# Patient Record
Sex: Male | Born: 1985 | Race: Black or African American | Hispanic: No | Marital: Married | Smoking: Current every day smoker
Health system: Southern US, Community
[De-identification: ages and names within clinical notes are randomized; demographics above are authoritative.]

## PROBLEM LIST (undated history)

## (undated) DIAGNOSIS — R0600 Dyspnea, unspecified: Secondary | ICD-10-CM

## (undated) HISTORY — PX: KNEE ARTHROSCOPY: SHX127

---
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
## 2004-03-22 ENCOUNTER — Emergency Department (HOSPITAL_COMMUNITY): Admission: EM | Admit: 2004-03-22 | Discharge: 2004-03-22 | Payer: Self-pay | Admitting: Family Medicine

## 2004-12-02 ENCOUNTER — Emergency Department (HOSPITAL_COMMUNITY): Admission: EM | Admit: 2004-12-02 | Discharge: 2004-12-02 | Payer: Self-pay | Admitting: Family Medicine

## 2005-06-30 ENCOUNTER — Emergency Department (HOSPITAL_COMMUNITY): Admission: EM | Admit: 2005-06-30 | Discharge: 2005-06-30 | Payer: Self-pay | Admitting: Emergency Medicine

## 2005-07-30 ENCOUNTER — Emergency Department (HOSPITAL_COMMUNITY): Admission: EM | Admit: 2005-07-30 | Discharge: 2005-07-30 | Payer: Self-pay | Admitting: Family Medicine

## 2005-07-31 ENCOUNTER — Emergency Department (HOSPITAL_COMMUNITY): Admission: EM | Admit: 2005-07-31 | Discharge: 2005-07-31 | Payer: Self-pay | Admitting: Family Medicine

## 2005-12-13 ENCOUNTER — Emergency Department (HOSPITAL_COMMUNITY): Admission: EM | Admit: 2005-12-13 | Discharge: 2005-12-13 | Payer: Self-pay | Admitting: Family Medicine

## 2007-03-22 ENCOUNTER — Emergency Department (HOSPITAL_COMMUNITY): Admission: EM | Admit: 2007-03-22 | Discharge: 2007-03-22 | Payer: Self-pay | Admitting: *Deleted

## 2007-09-01 ENCOUNTER — Emergency Department (HOSPITAL_COMMUNITY): Admission: EM | Admit: 2007-09-01 | Discharge: 2007-09-01 | Payer: Self-pay | Admitting: Family Medicine

## 2008-11-16 ENCOUNTER — Emergency Department (HOSPITAL_COMMUNITY): Admission: EM | Admit: 2008-11-16 | Discharge: 2008-11-16 | Payer: Self-pay | Admitting: Emergency Medicine

## 2012-06-13 ENCOUNTER — Encounter (HOSPITAL_COMMUNITY): Payer: Self-pay | Admitting: Emergency Medicine

## 2012-06-13 ENCOUNTER — Emergency Department (INDEPENDENT_AMBULATORY_CARE_PROVIDER_SITE_OTHER)
Admission: EM | Admit: 2012-06-13 | Discharge: 2012-06-13 | Disposition: A | Discharge status: Home / Self Care | Payer: PRIVATE HEALTH INSURANCE | Source: Home / Self Care | Attending: Family Medicine | Admitting: Family Medicine

## 2012-06-13 DIAGNOSIS — J069 Acute upper respiratory infection, unspecified: Secondary | ICD-10-CM

## 2012-06-13 LAB — POCT RAPID STREP A: Streptococcus, Group A Screen (Direct): NEGATIVE

## 2012-06-13 MED ORDER — GUAIFENESIN-CODEINE 100-10 MG/5ML PO SYRP: 5.0000 mL | ORAL_SOLUTION | Freq: Three times a day (TID) | ORAL | Status: DC | PRN

## 2012-06-13 MED ORDER — PROMETHAZINE HCL 25 MG PO TABS: 25.0000 mg | ORAL_TABLET | Freq: Three times a day (TID) | ORAL | Status: DC | PRN

## 2012-06-13 MED ORDER — CETIRIZINE-PSEUDOEPHEDRINE ER 5-120 MG PO TB12: 1.0000 | ORAL_TABLET | Freq: Two times a day (BID) | ORAL | Status: DC | PRN

## 2012-06-13 MED ORDER — BENZONATATE 100 MG PO CAPS: 100.0000 mg | ORAL_CAPSULE | Freq: Three times a day (TID) | ORAL | Status: DC

## 2012-06-13 NOTE — ED Provider Notes (Signed)
History     CSN: [id number]  Arrival date & time 06/13/12  1443   First MD Initiated Contact with Patient 06/13/12 1519      Chief Complaint  Patient presents with  . Hematemesis    (Consider location/radiation/quality/duration/timing/severity/associated sxs/prior treatment) HPI Comments: 27 year old smoker male. Here complaining of sore throat, nasal congestion, cough productive of clear sputum for 3 days. This morning symptoms associated with posttussive emesis with streaks of blood. Denies chest pain, shortness of breath or wheezing. No fever although reports he fell sweating after vomiting. Denies abdominal pain or diarrhea. Also denies current nausea, nausea or vomiting is triggered by cough. No prior history of gastric ulcers or hematemesis. No history of liver disease or jaundice. Denies recent alcohol intake. Denies ever having problems with heavy drinking.    History reviewed. No pertinent past medical history.  History reviewed. No pertinent past surgical history.  No family history on file.  History  Substance Use Topics  . Smoking status: Current Every Day Smoker -- 0.50 packs/day    Types: Cigarettes  . Smokeless tobacco: Not on file  . Alcohol Use: Yes      Review of Systems  Constitutional: Negative for fever, chills and appetite change.  HENT: Positive for congestion, sore throat and rhinorrhea.   Respiratory: Positive for cough. Negative for shortness of breath and wheezing.   Cardiovascular: Negative for chest pain.  Gastrointestinal: Negative for abdominal pain.       Posttussive emesis as per history present illness  Skin: Negative for rash.  Neurological: Negative for dizziness and headaches.  All other systems reviewed and are negative.    Allergies  Review of patient's allergies indicates no known allergies.  Home Medications   Current Outpatient Rx  Name  Route  Sig  Dispense  Refill  . benzonatate (TESSALON) 100 MG capsule   Oral   Take  1 capsule (100 mg total) by mouth every 8 (eight) hours.   21 capsule   0   . cetirizine-pseudoephedrine (ZYRTEC-D) 5-120 MG per tablet   Oral   Take 1 tablet by mouth 2 (two) times daily as needed for allergies.   30 tablet   0   . guaiFENesin-codeine (ROBITUSSIN AC) 100-10 MG/5ML syrup   Oral   Take 5 mLs by mouth 3 (three) times daily as needed for cough.   120 mL   0   . promethazine (PHENERGAN) 25 MG tablet   Oral   Take 1 tablet (25 mg total) by mouth every 8 (eight) hours as needed for nausea.   15 tablet   0     BP 122/69  Pulse 77  Temp(Src) 98.3 F (36.8 C) (Oral)  Resp 16  SpO2 97%  Physical Exam  Nursing note and vitals reviewed. Constitutional: He is oriented to person, place, and time. He appears well-developed and well-nourished. No distress.  HENT:  Head: Normocephalic and atraumatic.  Nasal Congestion with erythema and swelling of nasal turbinates, clear rhinorrhea. Significant pharyngeal erythema no exudates. No uvula deviation. No trismus. TM's normal  Eyes: Conjunctivae are normal. Right eye exhibits no discharge. Left eye exhibits no discharge. No scleral icterus.  Neck: Neck supple. No thyromegaly present.  Cardiovascular: Normal rate and regular rhythm.   Pulmonary/Chest: Effort normal and breath sounds normal. No respiratory distress. He has no wheezes. He has no rales. He exhibits no tenderness.  Abdominal: Soft. Bowel sounds are normal. He exhibits no distension and no mass. There is no tenderness. There is  no rebound and no guarding.  Lymphadenopathy:    He has no cervical adenopathy.  Neurological: He is alert and oriented to person, place, and time.  Skin: No rash noted. He is not diaphoretic.    ED Course  Procedures (including critical care time)  Labs Reviewed  POCT RAPID STREP A ([facility] ONLY)   No results found.   1. URI (upper respiratory infection)       MDM  Negative strep test. Lungs clear to auscultation.  Normal vital signs. Clinically well. Prescribe Tessalon Perles, cetirizine/pseudoephedrine, guaifenesin/codeine and Phenergan. Supportive care and red flags that should prompt his return to medical attention discussed with patient and provided in writing.        Sharin Grave, MD 06/14/12 1143

## 2012-06-13 NOTE — ED Notes (Signed)
Pt is concerned about an episode of hematemesis this am around 0615 Sx include: dry cough and face feels like its swollen Denies: ST, f/v/n/d, abd pain  He is alert and oriented w/no signs of acute distress.

## 2012-10-07 ENCOUNTER — Emergency Department (HOSPITAL_COMMUNITY)
Admission: EM | Admit: 2012-10-07 | Discharge: 2012-10-07 | Disposition: A | Discharge status: Home / Self Care | Payer: Self-pay | Source: Home / Self Care | Attending: Emergency Medicine | Admitting: Emergency Medicine

## 2012-10-07 ENCOUNTER — Encounter (HOSPITAL_COMMUNITY): Payer: Self-pay | Admitting: Emergency Medicine

## 2012-10-07 ENCOUNTER — Other Ambulatory Visit (HOSPITAL_COMMUNITY)
Admission: RE | Admit: 2012-10-07 | Discharge: 2012-10-07 | Disposition: A | Discharge status: Home / Self Care | Payer: Commercial Managed Care - PPO | Source: Ambulatory Visit | Attending: Emergency Medicine | Admitting: Emergency Medicine

## 2012-10-07 DIAGNOSIS — Z113 Encounter for screening for infections with a predominantly sexual mode of transmission: Secondary | ICD-10-CM | POA: Insufficient documentation

## 2012-10-07 DIAGNOSIS — Z202 Contact with and (suspected) exposure to infections with a predominantly sexual mode of transmission: Secondary | ICD-10-CM

## 2012-10-07 MED ORDER — METRONIDAZOLE 500 MG PO TABS: ORAL_TABLET | ORAL | Status: DC

## 2012-10-07 MED ORDER — LIDOCAINE HCL (PF) 1 % IJ SOLN
INTRAMUSCULAR | Status: AC
  Filled 2012-10-07: qty 5

## 2012-10-07 MED ORDER — CEFTRIAXONE SODIUM 1 G IJ SOLR
INTRAMUSCULAR | Status: AC
  Filled 2012-10-07: qty 10

## 2012-10-07 MED ORDER — AZITHROMYCIN 250 MG PO TABS
ORAL_TABLET | ORAL | Status: AC
  Filled 2012-10-07: qty 4

## 2012-10-07 MED ORDER — CEFTRIAXONE SODIUM 250 MG IJ SOLR
250.0000 mg | Freq: Once | INTRAMUSCULAR | Status: AC
  Administered 2012-10-07: 250 mg via INTRAMUSCULAR

## 2012-10-07 MED ORDER — AZITHROMYCIN 250 MG PO TABS
1000.0000 mg | ORAL_TABLET | Freq: Once | ORAL | Status: AC
  Administered 2012-10-07: 1000 mg via ORAL

## 2012-10-07 NOTE — ED Provider Notes (Signed)
Chief Complaint:   Chief Complaint  Patient presents with  . Exposure to STD    was told by partner that she tested positive for trich    History of Present Illness:   Mario Ingram is a 27 year old male who was told by his girlfriend that she tested positive for Trichomonas. He comes in today for treatment. He has not had any refill discharge, dysuria, penile pain, or penile lesions. He denies any inguinal adenopathy, abdominal pain, or testicular pain. He's had no fever, chills, skin rash, or joint pain. No prior history of STDs.  Review of Systems:  Other than noted above, the patient denies any of the following symptoms: Systemic:  No fevers chills, aches, weight loss, arthralgias, myalgias, or adenopathy. GI:  No abdominal pain, nausea or vomiting. GU:  No dysuria, penile pain, discharge, itching, dysuria, genital lesions, testicular pain or swelling. Skin:  No rash or itching.  PMFSH:  Past medical history, family history, social history, meds, and allergies were reviewed.   Physical Exam:   Vital signs:  BP 136/69  Pulse 81  Temp(Src) 98.2 F (36.8 C) (Oral)  SpO2 100% Gen:  Alert, oriented, in no distress. Abdomen:  Soft and flat, non-distended, and non-tender.  No organomegaly or mass. Genital:  Normal exam. No urethral discharge, no penile lesions, no inguinal lymphadenopathy, testes were normal. Skin:  Warm and dry.  No rash.   Labs:  DNA probe for gonorrhea, Chlamydia, Trichomonas obtained. Patient declines testing for HIV or syphilis.  Assessment:  The encounter diagnosis was Exposure to STD.  Exposure to Trichomonas. Will treat for that. Offered treatment for gonorrhea and Chlamydia as well, the patient would like to await results of DNA probes.  Plan:   1.  The following meds were prescribed:   Discharge Medication List as of 10/07/2012 10:00 AM    START taking these medications   Details  metroNIDAZOLE (FLAGYL) 500 MG tablet Take 2 tabs twice daily for 1 day.,  Normal       2.  The patient was instructed in symptomatic care and handouts were given. 3.  The patient was told to return if becoming worse in any way, if no better in 3 or 4 days, and given some red flag symptoms such as difficulty urinating that would indicate earlier return. 4.  The patient was instructed to inform all sexual contacts, avoid intercourse completely for 2 weeks and then only with a condom.  The patient was told that we would call about all abnormal lab results, and that we would need to report certain kinds of infection to the health department. 5.  Follow up here if necessary.    Reuben Likes, MD 10/07/12 [phone]

## 2012-10-07 NOTE — ED Notes (Signed)
Pt given injection will discharge at 10:22

## 2012-10-07 NOTE — ED Notes (Signed)
Pt here for treatment. Was told by partner that she tested positive for trich. Pt denies having any symptoms. At this time.

## 2013-03-26 ENCOUNTER — Ambulatory Visit (INDEPENDENT_AMBULATORY_CARE_PROVIDER_SITE_OTHER): Payer: Commercial Managed Care - PPO

## 2013-03-26 ENCOUNTER — Other Ambulatory Visit: Payer: Self-pay | Admitting: Family Medicine

## 2013-03-26 DIAGNOSIS — R52 Pain, unspecified: Secondary | ICD-10-CM

## 2013-03-26 DIAGNOSIS — M898X9 Other specified disorders of bone, unspecified site: Secondary | ICD-10-CM

## 2013-05-21 ENCOUNTER — Ambulatory Visit (INDEPENDENT_AMBULATORY_CARE_PROVIDER_SITE_OTHER): Payer: Commercial Managed Care - PPO | Admitting: Physical Therapy

## 2013-05-21 DIAGNOSIS — R269 Unspecified abnormalities of gait and mobility: Secondary | ICD-10-CM

## 2013-05-21 DIAGNOSIS — M25669 Stiffness of unspecified knee, not elsewhere classified: Secondary | ICD-10-CM

## 2013-05-21 DIAGNOSIS — M25569 Pain in unspecified knee: Secondary | ICD-10-CM

## 2013-05-21 DIAGNOSIS — R609 Edema, unspecified: Secondary | ICD-10-CM

## 2013-05-26 ENCOUNTER — Encounter (INDEPENDENT_AMBULATORY_CARE_PROVIDER_SITE_OTHER): Payer: Worker's Compensation | Admitting: Physical Therapy

## 2013-05-26 DIAGNOSIS — M25569 Pain in unspecified knee: Secondary | ICD-10-CM

## 2013-05-26 DIAGNOSIS — M25669 Stiffness of unspecified knee, not elsewhere classified: Secondary | ICD-10-CM

## 2013-05-26 DIAGNOSIS — R609 Edema, unspecified: Secondary | ICD-10-CM

## 2013-05-26 DIAGNOSIS — R269 Unspecified abnormalities of gait and mobility: Secondary | ICD-10-CM

## 2013-05-29 ENCOUNTER — Encounter (INDEPENDENT_AMBULATORY_CARE_PROVIDER_SITE_OTHER): Payer: Worker's Compensation

## 2013-05-29 DIAGNOSIS — M25669 Stiffness of unspecified knee, not elsewhere classified: Secondary | ICD-10-CM

## 2013-05-29 DIAGNOSIS — M25569 Pain in unspecified knee: Secondary | ICD-10-CM

## 2013-05-29 DIAGNOSIS — R269 Unspecified abnormalities of gait and mobility: Secondary | ICD-10-CM

## 2013-05-29 DIAGNOSIS — R609 Edema, unspecified: Secondary | ICD-10-CM

## 2013-06-04 ENCOUNTER — Encounter (INDEPENDENT_AMBULATORY_CARE_PROVIDER_SITE_OTHER): Payer: Worker's Compensation

## 2013-06-04 DIAGNOSIS — M25669 Stiffness of unspecified knee, not elsewhere classified: Secondary | ICD-10-CM

## 2013-06-04 DIAGNOSIS — M25569 Pain in unspecified knee: Secondary | ICD-10-CM

## 2013-06-04 DIAGNOSIS — R609 Edema, unspecified: Secondary | ICD-10-CM

## 2013-06-04 DIAGNOSIS — R269 Unspecified abnormalities of gait and mobility: Secondary | ICD-10-CM

## 2013-06-05 ENCOUNTER — Encounter (INDEPENDENT_AMBULATORY_CARE_PROVIDER_SITE_OTHER): Payer: Commercial Managed Care - PPO | Admitting: Physical Therapy

## 2013-06-05 DIAGNOSIS — R609 Edema, unspecified: Secondary | ICD-10-CM

## 2013-06-05 DIAGNOSIS — M25569 Pain in unspecified knee: Secondary | ICD-10-CM

## 2013-06-05 DIAGNOSIS — M25669 Stiffness of unspecified knee, not elsewhere classified: Secondary | ICD-10-CM

## 2013-06-05 DIAGNOSIS — R269 Unspecified abnormalities of gait and mobility: Secondary | ICD-10-CM

## 2013-06-09 ENCOUNTER — Encounter (INDEPENDENT_AMBULATORY_CARE_PROVIDER_SITE_OTHER): Payer: Worker's Compensation | Admitting: Physical Therapy

## 2013-06-09 DIAGNOSIS — M25569 Pain in unspecified knee: Secondary | ICD-10-CM

## 2013-06-09 DIAGNOSIS — R269 Unspecified abnormalities of gait and mobility: Secondary | ICD-10-CM

## 2013-06-09 DIAGNOSIS — M25669 Stiffness of unspecified knee, not elsewhere classified: Secondary | ICD-10-CM

## 2013-06-09 DIAGNOSIS — R609 Edema, unspecified: Secondary | ICD-10-CM

## 2013-06-11 ENCOUNTER — Encounter (INDEPENDENT_AMBULATORY_CARE_PROVIDER_SITE_OTHER): Payer: Worker's Compensation | Admitting: Physical Therapy

## 2013-06-11 DIAGNOSIS — M25669 Stiffness of unspecified knee, not elsewhere classified: Secondary | ICD-10-CM

## 2013-06-11 DIAGNOSIS — M25569 Pain in unspecified knee: Secondary | ICD-10-CM

## 2013-06-11 DIAGNOSIS — R269 Unspecified abnormalities of gait and mobility: Secondary | ICD-10-CM

## 2013-06-11 DIAGNOSIS — R609 Edema, unspecified: Secondary | ICD-10-CM

## 2013-06-12 ENCOUNTER — Encounter: Payer: Self-pay | Admitting: Physical Therapy

## 2013-06-16 ENCOUNTER — Encounter (INDEPENDENT_AMBULATORY_CARE_PROVIDER_SITE_OTHER): Payer: Worker's Compensation | Admitting: Physical Therapy

## 2013-06-16 DIAGNOSIS — M25669 Stiffness of unspecified knee, not elsewhere classified: Secondary | ICD-10-CM

## 2013-06-16 DIAGNOSIS — M25569 Pain in unspecified knee: Secondary | ICD-10-CM

## 2013-06-16 DIAGNOSIS — R609 Edema, unspecified: Secondary | ICD-10-CM

## 2013-06-16 DIAGNOSIS — R269 Unspecified abnormalities of gait and mobility: Secondary | ICD-10-CM

## 2013-06-18 ENCOUNTER — Encounter (INDEPENDENT_AMBULATORY_CARE_PROVIDER_SITE_OTHER): Payer: Worker's Compensation

## 2013-06-18 DIAGNOSIS — M25669 Stiffness of unspecified knee, not elsewhere classified: Secondary | ICD-10-CM

## 2013-06-18 DIAGNOSIS — M25569 Pain in unspecified knee: Secondary | ICD-10-CM

## 2013-06-18 DIAGNOSIS — R269 Unspecified abnormalities of gait and mobility: Secondary | ICD-10-CM

## 2013-06-18 DIAGNOSIS — R609 Edema, unspecified: Secondary | ICD-10-CM

## 2013-06-23 ENCOUNTER — Encounter (INDEPENDENT_AMBULATORY_CARE_PROVIDER_SITE_OTHER): Payer: Worker's Compensation | Admitting: Physical Therapy

## 2013-06-23 DIAGNOSIS — R609 Edema, unspecified: Secondary | ICD-10-CM

## 2013-06-23 DIAGNOSIS — M25569 Pain in unspecified knee: Secondary | ICD-10-CM

## 2013-06-23 DIAGNOSIS — R269 Unspecified abnormalities of gait and mobility: Secondary | ICD-10-CM

## 2013-06-23 DIAGNOSIS — M25669 Stiffness of unspecified knee, not elsewhere classified: Secondary | ICD-10-CM

## 2013-06-25 ENCOUNTER — Encounter (INDEPENDENT_AMBULATORY_CARE_PROVIDER_SITE_OTHER): Payer: Worker's Compensation | Admitting: Physical Therapy

## 2013-06-25 DIAGNOSIS — M25669 Stiffness of unspecified knee, not elsewhere classified: Secondary | ICD-10-CM

## 2013-06-25 DIAGNOSIS — R609 Edema, unspecified: Secondary | ICD-10-CM

## 2013-06-25 DIAGNOSIS — M25569 Pain in unspecified knee: Secondary | ICD-10-CM

## 2013-06-25 DIAGNOSIS — R293 Abnormal posture: Secondary | ICD-10-CM

## 2013-07-02 ENCOUNTER — Encounter (INDEPENDENT_AMBULATORY_CARE_PROVIDER_SITE_OTHER): Payer: Worker's Compensation | Admitting: Physical Therapy

## 2013-07-02 DIAGNOSIS — M25669 Stiffness of unspecified knee, not elsewhere classified: Secondary | ICD-10-CM

## 2013-07-02 DIAGNOSIS — R269 Unspecified abnormalities of gait and mobility: Secondary | ICD-10-CM

## 2013-07-02 DIAGNOSIS — R609 Edema, unspecified: Secondary | ICD-10-CM

## 2013-07-02 DIAGNOSIS — M25569 Pain in unspecified knee: Secondary | ICD-10-CM

## 2013-08-18 ENCOUNTER — Emergency Department (INDEPENDENT_AMBULATORY_CARE_PROVIDER_SITE_OTHER)
Admission: EM | Admit: 2013-08-18 | Discharge: 2013-08-18 | Disposition: A | Discharge status: Home / Self Care | Payer: Commercial Managed Care - PPO | Source: Home / Self Care | Attending: Family Medicine | Admitting: Family Medicine

## 2013-08-18 ENCOUNTER — Encounter (HOSPITAL_COMMUNITY): Payer: Self-pay | Admitting: Emergency Medicine

## 2013-08-18 DIAGNOSIS — K2900 Acute gastritis without bleeding: Secondary | ICD-10-CM

## 2013-08-18 MED ORDER — GI COCKTAIL ~~LOC~~
30.0000 mL | Freq: Once | ORAL | Status: AC
  Administered 2013-08-18: 30 mL via ORAL

## 2013-08-18 MED ORDER — RANITIDINE HCL 150 MG PO TABS: 150.0000 mg | ORAL_TABLET | Freq: Two times a day (BID) | ORAL | Status: DC

## 2013-08-18 MED ORDER — GI COCKTAIL ~~LOC~~
ORAL | Status: AC
Start: 2013-08-18 — End: 2013-08-18
  Filled 2013-08-18: qty 30

## 2013-08-18 NOTE — ED Provider Notes (Signed)
CSN: [id number]     Arrival date & time 08/18/13  1822 History   First MD Initiated Contact with Patient 08/18/13 1917     No chief complaint on file.  (Consider location/radiation/quality/duration/timing/severity/associated sxs/prior Treatment) Patient is a 28 y.o. male presenting with abdominal pain. The history is provided by the patient.  Abdominal Pain Pain location:  Epigastric Pain quality: burning   Pain radiates to:  Does not radiate Pain severity:  Mild Onset quality:  Gradual Duration:  2 days Chronicity:  New Context: not laxative use   Relieved by:  None tried Worsened by:  Nothing tried Ineffective treatments:  None tried Associated symptoms: belching and flatus   Associated symptoms: no constipation, no diarrhea, no fever, no nausea and no vomiting     No past medical history on file. No past surgical history on file. No family history on file. History  Substance Use Topics  . Smoking status: Current Every Day Smoker -- 0.50 packs/day    Types: Cigarettes  . Smokeless tobacco: Not on file  . Alcohol Use: Yes    Review of Systems  Constitutional: Negative.  Negative for fever.  Cardiovascular: Negative.   Gastrointestinal: Positive for abdominal pain and flatus. Negative for nausea, vomiting, diarrhea, constipation and blood in stool.  Skin: Negative.     Allergies  Review of patient's allergies indicates no known allergies.  Home Medications   Prior to Admission medications   Medication Sig Start Date End Date Taking? Authorizing Provider  benzonatate (TESSALON) 100 MG capsule Take 1 capsule (100 mg total) by mouth every 8 (eight) hours. 06/13/12   Adlih Moreno-Coll, MD  cetirizine-pseudoephedrine (ZYRTEC-D) 5-120 MG per tablet Take 1 tablet by mouth 2 (two) times daily as needed for allergies. 06/13/12   Adlih Moreno-Coll, MD  guaiFENesin-codeine (ROBITUSSIN AC) 100-10 MG/5ML syrup Take 5 mLs by mouth 3 (three) times daily as needed for cough. 06/13/12    Adlih Moreno-Coll, MD  metroNIDAZOLE (FLAGYL) 500 MG tablet Take 2 tabs twice daily for 1 day. 10/07/12   Reuben Likesavid C Keller, MD  promethazine (PHENERGAN) 25 MG tablet Take 1 tablet (25 mg total) by mouth every 8 (eight) hours as needed for nausea. 06/13/12   Adlih Moreno-Coll, MD  ranitidine (ZANTAC) 150 MG tablet Take 1 tablet (150 mg total) by mouth 2 (two) times daily. 08/18/13   Linna HoffJames D Shaely Gadberry, MD   BP 140/85  Pulse 67  Temp(Src) 98.5 F (36.9 C) (Oral)  SpO2 100% Physical Exam  Nursing note and vitals reviewed. Constitutional: He is oriented to person, place, and time. He appears well-developed and well-nourished.  Neck: Normal range of motion. Neck supple.  Cardiovascular: Normal heart sounds.   Pulmonary/Chest: Effort normal and breath sounds normal.  Abdominal: Soft. Bowel sounds are normal. He exhibits no distension and no mass. There is no tenderness. There is no rebound and no guarding.  Lymphadenopathy:    He has no cervical adenopathy.  Neurological: He is alert and oriented to person, place, and time.  Skin: Skin is warm and dry.    ED Course  Procedures (including critical care time) Labs Review Labs Reviewed - No data to display  Imaging Review No results found.   MDM   1. Acute gastritis without hemorrhage        Linna HoffJames D Taseen Marasigan, MD 08/18/13 [phone]

## 2013-08-18 NOTE — ED Notes (Signed)
Concern for epigastric pain and constipation

## 2014-06-17 ENCOUNTER — Encounter (HOSPITAL_COMMUNITY): Payer: Self-pay | Admitting: Emergency Medicine

## 2014-06-17 ENCOUNTER — Emergency Department (INDEPENDENT_AMBULATORY_CARE_PROVIDER_SITE_OTHER)
Admission: EM | Admit: 2014-06-17 | Discharge: 2014-06-17 | Disposition: A | Discharge status: Home / Self Care | Payer: No Typology Code available for payment source | Source: Home / Self Care | Attending: Family Medicine | Admitting: Family Medicine

## 2014-06-17 DIAGNOSIS — J111 Influenza due to unidentified influenza virus with other respiratory manifestations: Secondary | ICD-10-CM | POA: Diagnosis not present

## 2014-06-17 MED ORDER — OSELTAMIVIR PHOSPHATE 75 MG PO CAPS: 75.0000 mg | ORAL_CAPSULE | Freq: Two times a day (BID) | ORAL | Status: DC

## 2014-06-17 NOTE — ED Provider Notes (Signed)
Mario QuailsBrandon Ingram is a 29 y.o. male who presents to Urgent Care today for fevers chills for this or body ache and congestion. Symptoms present for one day. Wife and child both were diagnosed with the flu and treated with Tamiflu. No vomiting or diarrhea. No chest pains or palpitations. No flu vaccine this year. He has tried Mucinex which helps.   History reviewed. No pertinent past medical history. History reviewed. No pertinent past surgical history. History  Substance Use Topics  . Smoking status: Current Every Day Smoker -- 0.50 packs/day    Types: Cigarettes  . Smokeless tobacco: Not on file  . Alcohol Use: Yes   ROS as above Medications: No current facility-administered medications for this encounter.   Current Outpatient Prescriptions  Medication Sig Dispense Refill  . oseltamivir (TAMIFLU) 75 MG capsule Take 1 capsule (75 mg total) by mouth every 12 (twelve) hours. 10 capsule 0  . ranitidine (ZANTAC) 150 MG tablet Take 1 tablet (150 mg total) by mouth 2 (two) times daily. 60 tablet 0  . [DISCONTINUED] promethazine (PHENERGAN) 25 MG tablet Take 1 tablet (25 mg total) by mouth every 8 (eight) hours as needed for nausea. 15 tablet 0   No Known Allergies   Exam:  BP 143/75 mmHg  Pulse 79  Temp(Src) 98.8 F (37.1 C) (Oral)  Resp 18  SpO2 96% Gen: Well NAD HEENT: EOMI,  MMM normal posterior pharynx and tympanic membranes Lungs: Normal work of breathing. CTABL Heart: RRR no MRG Abd: NABS, Soft. Nondistended, Nontender Exts: Brisk capillary refill, warm and well perfused.   No results found for this or any previous visit (from the past 24 hour(s)). No results found.  Assessment and Plan: 29 y.o. male with influenza. Treatment with Tamiflu and over-the-counter medications for symptomatic management. Work note provided  Discussed warning signs or symptoms. Please see discharge instructions. Patient expresses understanding.     Rodolph BongEvan S Latise Dilley, MD 06/17/14 [phone]

## 2014-06-17 NOTE — ED Notes (Signed)
Pt states that he has had fever cold and chills since yesterday. He states that he checked his fever before work this morning and it was 103 degrees

## 2014-06-17 NOTE — Discharge Instructions (Signed)
Thank you for coming in today. °Call or go to the emergency room if you get worse, have trouble breathing, have chest pains, or palpitations.  ° °Influenza °Influenza ("the flu") is a viral infection of the respiratory tract. It occurs more often in winter months because people spend more time in close contact with one another. Influenza can make you feel very sick. Influenza easily spreads from person to person (contagious). °CAUSES  °Influenza is caused by a virus that infects the respiratory tract. You can catch the virus by breathing in droplets from an infected person's cough or sneeze. You can also catch the virus by touching something that was recently contaminated with the virus and then touching your mouth, nose, or eyes. °RISKS AND COMPLICATIONS °You may be at risk for a more severe case of influenza if you smoke cigarettes, have diabetes, have chronic heart disease (such as heart failure) or lung disease (such as asthma), or if you have a weakened immune system. Elderly people and pregnant women are also at risk for more serious infections. The most common problem of influenza is a lung infection (pneumonia). Sometimes, this problem can require emergency medical care and may be life threatening. °SIGNS AND SYMPTOMS  °Symptoms typically last 4 to 10 days and may include: °· Fever. °· Chills. °· Headache, body aches, and muscle aches. °· Sore throat. °· Chest discomfort and cough. °· Poor appetite. °· Weakness or feeling tired. °· Dizziness. °· Nausea or vomiting. °DIAGNOSIS  °Diagnosis of influenza is often made based on your history and a physical exam. A nose or throat swab test can be done to confirm the diagnosis. °TREATMENT  °In mild cases, influenza goes away on its own. Treatment is directed at relieving symptoms. For more severe cases, your health care provider may prescribe antiviral medicines to shorten the sickness. Antibiotic medicines are not effective because the infection is caused by a  virus, not by bacteria. °HOME CARE INSTRUCTIONS °· Take medicines only as directed by your health care provider. °· Use a cool mist humidifier to make breathing easier. °· Get plenty of rest until your temperature returns to normal. This usually takes 3 to 4 days. °· Drink enough fluid to keep your urine clear or pale yellow. °· Cover your mouth and nose when coughing or sneezing, and wash your hands well to prevent the virus from spreading. °· Stay home from work or school until the fever is gone for at least 1 full day. °PREVENTION  °An annual influenza vaccination (flu shot) is the best way to avoid getting influenza. An annual flu shot is now routinely recommended for all adults in the [place] °SEEK MEDICAL CARE IF: °· You experience chest pain, your cough worsens, or you produce more mucus. °· You have nausea, vomiting, or diarrhea. °· Your fever returns or gets worse. °SEEK IMMEDIATE MEDICAL CARE IF: °· You have trouble breathing, you become short of breath, or your skin or nails become bluish. °· You have severe pain or stiffness in the neck. °· You develop a sudden headache, or pain in the face or ear. °· You have nausea or vomiting that you cannot control. °MAKE SURE YOU:  °· Understand these instructions. °· Will watch your condition. °· Will get help right away if you are not doing well or get worse. °Document Released: 01/21/2000 Document Revised: 06/09/2013 Document Reviewed: 04/24/2011 °[employer]® Patient Information ©2015 [employer]. This information is not intended to replace advice given to you by your health care provider. Make sure you discuss any questions you have with your health care provider. ° °

## 2016-03-13 ENCOUNTER — Ambulatory Visit (HOSPITAL_COMMUNITY)
Admission: EM | Admit: 2016-03-13 | Discharge: 2016-03-13 | Disposition: A | Discharge status: Home / Self Care | Payer: Self-pay | Attending: Internal Medicine | Admitting: Internal Medicine

## 2016-03-13 DIAGNOSIS — S161XXA Strain of muscle, fascia and tendon at neck level, initial encounter: Secondary | ICD-10-CM

## 2016-03-13 DIAGNOSIS — M542 Cervicalgia: Secondary | ICD-10-CM

## 2016-03-13 MED ORDER — KETOROLAC TROMETHAMINE 60 MG/2ML IM SOLN
60.0000 mg | Freq: Once | INTRAMUSCULAR | Status: AC
  Administered 2016-03-13: 60 mg via INTRAMUSCULAR

## 2016-03-13 MED ORDER — CYCLOBENZAPRINE HCL 10 MG PO TABS: 10.0000 mg | ORAL_TABLET | Freq: Every day | ORAL | 0 refills | Status: DC

## 2016-03-13 MED ORDER — KETOROLAC TROMETHAMINE 60 MG/2ML IM SOLN
INTRAMUSCULAR | Status: AC
  Filled 2016-03-13: qty 2

## 2016-03-13 MED ORDER — NAPROXEN 500 MG PO TABS: 500.0000 mg | ORAL_TABLET | Freq: Two times a day (BID) | ORAL | 0 refills | Status: DC

## 2016-03-13 NOTE — ED Provider Notes (Signed)
CSN: [id number]     Arrival date & time 03/13/16  1124 History   None    Chief Complaint  Patient presents with  . Neck Pain   (Consider location/radiation/quality/duration/timing/severity/associated sxs/prior Treatment) Patient states he awoke this am and his neck is stiff and painful.  He awoke this am with the neck discomfort and decreased ROM in his neck. He states he took a muscle relaxer w/o results.   The history is provided by the patient.  Muscle Pain  This is a new problem. The problem occurs constantly. The problem has not changed since onset.Nothing aggravates the symptoms. Nothing relieves the symptoms. He has tried nothing for the symptoms.    No past medical history on file. No past surgical history on file. No family history on file. Social History  Substance Use Topics  . Smoking status: Current Every Day Smoker    Packs/day: 0.50    Types: Cigarettes  . Smokeless tobacco: Not on file  . Alcohol use Yes    Review of Systems  Constitutional: Negative.   HENT: Negative.   Eyes: Negative.   Respiratory: Negative.   Cardiovascular: Negative.   Gastrointestinal: Negative.   Endocrine: Negative.   Genitourinary: Negative.   Musculoskeletal: Positive for myalgias.  Skin: Negative.   Allergic/Immunologic: Negative.   Hematological: Negative.   Psychiatric/Behavioral: Negative.     Allergies  Patient has no known allergies.  Home Medications   Prior to Admission medications   Medication Sig Start Date End Date Taking? Authorizing Provider  cyclobenzaprine (FLEXERIL) 10 MG tablet Take 1 tablet (10 mg total) by mouth at bedtime. 03/13/16   Deatra Canter, FNP  naproxen (NAPROSYN) 500 MG tablet Take 1 tablet (500 mg total) by mouth 2 (two) times daily with a meal. 03/13/16   Deatra Canter, FNP  oseltamivir (TAMIFLU) 75 MG capsule Take 1 capsule (75 mg total) by mouth every 12 (twelve) hours. 06/17/14   Rodolph Bong, MD  ranitidine (ZANTAC) 150 MG tablet Take  1 tablet (150 mg total) by mouth 2 (two) times daily. 08/18/13   Linna Hoff, MD   Meds Ordered and Administered this Visit   Medications  ketorolac (TORADOL) injection 60 mg (not administered)    BP 119/65 (BP Location: Left Arm)   Pulse 69   Temp 98.5 F (36.9 C) (Oral)   Resp 16   SpO2 99%  No data found.   Physical Exam  Constitutional: He appears well-developed and well-nourished.  HENT:  Head: Normocephalic and atraumatic.  Eyes: Conjunctivae and EOM are normal. Pupils are equal, round, and reactive to light.  Neck: Normal range of motion. Neck supple.  Cardiovascular: Normal rate, regular rhythm and normal heart sounds.   Pulmonary/Chest: Effort normal and breath sounds normal.  Musculoskeletal: He exhibits tenderness.  Decreased ROM cervical spine. TTP bilateral cervical paraspinous muscles.  Nursing note and vitals reviewed.   Urgent Care Course     Procedures (including critical care time)  Labs Review Labs Reviewed - No data to display  Imaging Review No results found.   Visual Acuity Review  Right Eye Distance:   Left Eye Distance:   Bilateral Distance:    Right Eye Near:   Left Eye Near:    Bilateral Near:         MDM   1. Neck pain   2. Acute strain of neck muscle, initial encounter    Toradol 60mg  IM Naprosyn 500mg  one po bid x 10 days Flexeril 10mg   one po qhs prn #10      Deatra CanterWilliam J Oxford, FNP 03/13/16 1420

## 2016-03-13 NOTE — ED Triage Notes (Addendum)
C/o neck pain States he woke up and was not able to move head States he thought it was a crook in his neck at first Had some muscle  Relaxer but it did not help   No injury

## 2016-12-10 ENCOUNTER — Ambulatory Visit (HOSPITAL_COMMUNITY)
Admission: EM | Admit: 2016-12-10 | Discharge: 2016-12-10 | Disposition: A | Discharge status: Home / Self Care | Payer: No Typology Code available for payment source | Attending: Internal Medicine | Admitting: Internal Medicine

## 2016-12-10 ENCOUNTER — Encounter (HOSPITAL_COMMUNITY): Payer: Self-pay | Admitting: Emergency Medicine

## 2016-12-10 ENCOUNTER — Ambulatory Visit (INDEPENDENT_AMBULATORY_CARE_PROVIDER_SITE_OTHER): Payer: Self-pay

## 2016-12-10 DIAGNOSIS — S62511A Displaced fracture of proximal phalanx of right thumb, initial encounter for closed fracture: Secondary | ICD-10-CM

## 2016-12-10 DIAGNOSIS — M79641 Pain in right hand: Secondary | ICD-10-CM

## 2016-12-10 DIAGNOSIS — S62201A Unspecified fracture of first metacarpal bone, right hand, initial encounter for closed fracture: Secondary | ICD-10-CM

## 2016-12-10 MED ORDER — HYDROCODONE-ACETAMINOPHEN 5-325 MG PO TABS: 1.0000 | ORAL_TABLET | Freq: Four times a day (QID) | ORAL | 0 refills | Status: DC | PRN

## 2016-12-10 NOTE — ED Provider Notes (Signed)
[facility]    CSN: [id number] Arrival date & time: 12/10/16  1159     History   Chief Complaint Chief Complaint  Patient presents with  . Finger Injury    HPI Mario Ingram is a 31 y.o. male.   31 year -old male, presents today complaining of right hand pain. States that he was playing football yesterday and jammed his right thumb into a helmet. He has had pain to the base of the right thumb as well as swelling since that time. Patient is right handed. Denies any other injuries or complaints    The history is provided by the patient.  Hand Injury  Location:  Finger Finger location:  R thumb Injury: yes   Time since incident:  1 day Mechanism of injury comment:  Jammed right thumb into helmet  Pain details:    Quality:  Aching   Radiates to:  Does not radiate   Severity:  Moderate   Onset quality:  Gradual   Duration:  1 day   Timing:  Constant   Progression:  Unchanged Handedness:  Right-handed Dislocation: no   Foreign body present:  No foreign bodies Tetanus status:  Unknown Prior injury to area:  No Relieved by:  Nothing Worsened by:  Movement and stretching area Ineffective treatments:  Rest Associated symptoms: decreased range of motion and swelling   Associated symptoms: no back pain, no fatigue, no fever, no muscle weakness, no neck pain, no numbness, no stiffness and no tingling   Risk factors: no concern for non-accidental trauma, no known bone disorder, no frequent fractures and no recent illness     History reviewed. No pertinent past medical history.  There are no active problems to display for this patient.   History reviewed. No pertinent surgical history.     Home Medications    Prior to Admission medications   Medication Sig Start Date End Date Taking? Authorizing Provider  cyclobenzaprine (FLEXERIL) 10 MG tablet Take 1 tablet (10 mg total) by mouth at bedtime. 03/13/16   Deatra Canter, FNP  HYDROcodone-acetaminophen  (NORCO/VICODIN) 5-325 MG tablet Take 1 tablet every 6 (six) hours as needed by mouth. 12/10/16   Blue, Olivia C, PA-C  naproxen (NAPROSYN) 500 MG tablet Take 1 tablet (500 mg total) by mouth 2 (two) times daily with a meal. 03/13/16   Deatra Canter, FNP  oseltamivir (TAMIFLU) 75 MG capsule Take 1 capsule (75 mg total) by mouth every 12 (twelve) hours. 06/17/14   Rodolph Bong, MD  ranitidine (ZANTAC) 150 MG tablet Take 1 tablet (150 mg total) by mouth 2 (two) times daily. 08/18/13   Linna Hoff, MD    Family History History reviewed. No pertinent family history.  Social History Social History   Tobacco Use  . Smoking status: Current Every Day Smoker    Packs/day: 0.50    Types: Cigarettes  . Smokeless tobacco: Never Used  Substance Use Topics  . Alcohol use: Yes  . Drug use: No     Allergies   Patient has no known allergies.   Review of Systems Review of Systems  Constitutional: Negative for chills, fatigue and fever.  HENT: Negative for ear pain and sore throat.   Eyes: Negative for pain and visual disturbance.  Respiratory: Negative for cough and shortness of breath.   Cardiovascular: Negative for chest pain and palpitations.  Gastrointestinal: Negative for abdominal pain and vomiting.  Genitourinary: Negative for dysuria and hematuria.  Musculoskeletal: Positive for arthralgias (  right hand ) and joint swelling. Negative for back pain, neck pain and stiffness.  Skin: Negative for color change and rash.  Neurological: Negative for seizures and syncope.  All other systems reviewed and are negative.    Physical Exam Triage Vital Signs ED Triage Vitals  Enc Vitals Group     BP 12/10/16 1234 (!) 154/81     Pulse Rate 12/10/16 1234 84     Resp 12/10/16 1234 18     Temp 12/10/16 1234 98.3 F (36.8 C)     Temp Source 12/10/16 1234 Oral     SpO2 12/10/16 1234 96 %     Weight --      Height --      Head Circumference --      Peak Flow --      Pain Score 12/10/16  1241 7     Pain Loc --      Pain Edu? --      Excl. in GC? --    No data found.  Updated Vital Signs BP (!) 154/81 (BP Location: Left Arm) Comment: Notified Nurses  Pulse 84   Temp 98.3 F (36.8 C) (Oral)   Resp 18   SpO2 96%   Visual Acuity Right Eye Distance:   Left Eye Distance:   Bilateral Distance:    Right Eye Near:   Left Eye Near:    Bilateral Near:     Physical Exam  Constitutional: He appears well-developed and well-nourished.  HENT:  Head: Normocephalic and atraumatic.  Eyes: Conjunctivae are normal.  Neck: Neck supple.  Cardiovascular: Normal rate and regular rhythm.  No murmur heard. Pulmonary/Chest: Effort normal and breath sounds normal. No respiratory distress.  Abdominal: Soft. There is no tenderness.  Musculoskeletal: He exhibits no edema.       Right hand: He exhibits decreased range of motion, tenderness and swelling.       Hands: Pain and swelling to the base of the right thumb. Decreased ROM secondary to pain. Good radial pulse and cap refill. NV intact  Neurological: He is alert.  Skin: Skin is warm and dry.  Psychiatric: He has a normal mood and affect.  Nursing note and vitals reviewed.    UC Treatments / Results  Labs (all labs ordered are listed, but only abnormal results are displayed) Labs Reviewed - No data to display  EKG  EKG Interpretation None       Radiology Dg Finger Thumb Right  Result Date: 12/10/2016 CLINICAL DATA:  Focal injury. Jammed right thumb against trauma while playing football yesterday. Metacarpal swelling and pain. EXAM: RIGHT THUMB 2+V COMPARISON:  None. FINDINGS: A comminuted fracture of the proximal first metacarpal extends to the joint surface. Lateral angulation is noted. No radiopaque foreign body is present. The MCP joint is intact. IMPRESSION: Comminuted fracture at the base of the first metacarpal with extension to the first [facility]. Electronically Signed   By: Marin Robertshristopher  Mattern M.D.   On:  12/10/2016 13:35    Procedures Procedures (including critical care time)  Medications Ordered in UC Medications - No data to display   Initial Impression / Assessment and Plan / UC Course  I have reviewed the triage vital signs and the nursing notes.  Pertinent labs & imaging results that were available during my care of the patient were reviewed by me and considered in my medical decision making (see chart for details).     Right thumb pain - comminuted fracture of the base of  the right thumb that extends intra-articularly into the [facility] joint. Discussed the patient with Dr. Magnus Ivan - recommends thumb spica and follow-up in the office.  Dr. Amanda Pea is aware of the patient and will see the patient in the office tomorrow morning at 8am  Final Clinical Impressions(s) / UC Diagnoses   Final diagnoses:  Closed displaced fracture of proximal phalanx of right thumb, initial encounter    New Prescriptions This SmartLink is deprecated. Use AVSMEDLIST instead to display the medication list for a patient.   Controlled Substance Prescriptions [place] Controlled Substance Registry consulted? Not Applicable   Alecia Lemming, [place] 12/10/16 1512

## 2016-12-10 NOTE — ED Notes (Signed)
Spoke with Ortho, they will be up here shortly

## 2016-12-10 NOTE — ED Triage Notes (Signed)
Pt reports he jammed his right thumb against a helmet while playing football yest  Sx include swelling and pain  A&O x4... NAD... Ambulatory

## 2016-12-10 NOTE — Progress Notes (Signed)
Orthopedic Tech Progress Note Patient Details:  Mario Ingram 1985-09-03 [id number]  Ortho Devices Type of Ortho Device: Ace wrap, Thumb spica splint, Arm sling Splint Material: Fiberglass Ortho Device/Splint Interventions: Application   Saul FordyceJennifer C Sophonie Goforth 12/10/2016, 4:57 PM

## 2016-12-10 NOTE — ED Notes (Signed)
Pt given discharged instructions. Waiting for ortho tech to place splint

## 2016-12-12 ENCOUNTER — Ambulatory Visit: Payer: Self-pay | Admitting: Orthopedic Surgery

## 2016-12-12 ENCOUNTER — Other Ambulatory Visit: Payer: Self-pay | Admitting: Orthopedic Surgery

## 2016-12-12 ENCOUNTER — Encounter (HOSPITAL_COMMUNITY): Payer: Self-pay | Admitting: *Deleted

## 2016-12-12 ENCOUNTER — Other Ambulatory Visit: Payer: Self-pay

## 2016-12-13 ENCOUNTER — Encounter (HOSPITAL_COMMUNITY)
Admission: RE | Disposition: A | Discharge status: Home / Self Care | Payer: Self-pay | Source: Ambulatory Visit | Attending: Orthopedic Surgery

## 2016-12-13 ENCOUNTER — Encounter (HOSPITAL_COMMUNITY): Payer: Self-pay | Admitting: Orthopedic Surgery

## 2016-12-13 ENCOUNTER — Ambulatory Visit (HOSPITAL_COMMUNITY): Payer: Self-pay | Admitting: Anesthesiology

## 2016-12-13 ENCOUNTER — Ambulatory Visit (HOSPITAL_COMMUNITY)
Admission: RE | Admit: 2016-12-13 | Discharge: 2016-12-13 | Disposition: A | Discharge status: Home / Self Care | Payer: Self-pay | Source: Ambulatory Visit | Attending: Orthopedic Surgery | Admitting: Orthopedic Surgery

## 2016-12-13 DIAGNOSIS — F17200 Nicotine dependence, unspecified, uncomplicated: Secondary | ICD-10-CM | POA: Insufficient documentation

## 2016-12-13 DIAGNOSIS — S62501A Fracture of unspecified phalanx of right thumb, initial encounter for closed fracture: Secondary | ICD-10-CM | POA: Insufficient documentation

## 2016-12-13 DIAGNOSIS — Z6832 Body mass index (BMI) 32.0-32.9, adult: Secondary | ICD-10-CM | POA: Insufficient documentation

## 2016-12-13 DIAGNOSIS — X58XXXA Exposure to other specified factors, initial encounter: Secondary | ICD-10-CM | POA: Insufficient documentation

## 2016-12-13 DIAGNOSIS — E669 Obesity, unspecified: Secondary | ICD-10-CM | POA: Insufficient documentation

## 2016-12-13 HISTORY — DX: Dyspnea, unspecified: R06.00

## 2016-12-13 HISTORY — PX: OPEN REDUCTION INTERNAL FIXATION (ORIF) METACARPAL: SHX6234

## 2016-12-13 LAB — CBC
HEMATOCRIT: 45.1 % (ref 39.0–52.0)
HEMOGLOBIN: 15.8 g/dL (ref 13.0–17.0)
MCH: 31 pg (ref 26.0–34.0)
MCHC: 35 g/dL (ref 30.0–36.0)
MCV: 88.4 fL (ref 78.0–100.0)
Platelets: 179 10*3/uL (ref 150–400)
RBC: 5.1 MIL/uL (ref 4.22–5.81)
RDW: 13.3 % (ref 11.5–15.5)
WBC: 8.6 10*3/uL (ref 4.0–10.5)

## 2016-12-13 SURGERY — OPEN REDUCTION INTERNAL FIXATION (ORIF) METACARPAL
Anesthesia: General | Laterality: Right

## 2016-12-13 MED ORDER — MIDAZOLAM HCL 5 MG/5ML IJ SOLN
INTRAMUSCULAR | Status: DC | PRN
  Administered 2016-12-13: 2 mg via INTRAVENOUS

## 2016-12-13 MED ORDER — FENTANYL CITRATE (PF) 250 MCG/5ML IJ SOLN
INTRAMUSCULAR | Status: AC
  Filled 2016-12-13: qty 5

## 2016-12-13 MED ORDER — DEXMEDETOMIDINE HCL IN NACL 200 MCG/50ML IV SOLN
INTRAVENOUS | Status: AC
  Filled 2016-12-13: qty 50

## 2016-12-13 MED ORDER — FENTANYL CITRATE (PF) 250 MCG/5ML IJ SOLN
INTRAMUSCULAR | Status: DC | PRN
  Administered 2016-12-13: 25 ug via INTRAVENOUS
  Administered 2016-12-13: 50 ug via INTRAVENOUS
  Administered 2016-12-13: 25 ug via INTRAVENOUS
  Administered 2016-12-13 (×2): 50 ug via INTRAVENOUS

## 2016-12-13 MED ORDER — BUPIVACAINE HCL (PF) 0.25 % IJ SOLN
INTRAMUSCULAR | Status: DC | PRN
  Administered 2016-12-13: 20 mL

## 2016-12-13 MED ORDER — OXYCODONE HCL 5 MG/5ML PO SOLN: 5.0000 mg | Freq: Once | ORAL | Status: AC | PRN

## 2016-12-13 MED ORDER — MIDAZOLAM HCL 2 MG/2ML IJ SOLN
INTRAMUSCULAR | Status: AC
  Filled 2016-12-13: qty 2

## 2016-12-13 MED ORDER — EPHEDRINE SULFATE 50 MG/ML IJ SOLN
INTRAMUSCULAR | Status: DC | PRN
  Administered 2016-12-13: 5 mg via INTRAVENOUS

## 2016-12-13 MED ORDER — LIDOCAINE HCL (CARDIAC) 20 MG/ML IV SOLN
INTRAVENOUS | Status: DC | PRN
  Administered 2016-12-13: 100 mg via INTRAVENOUS

## 2016-12-13 MED ORDER — LACTATED RINGERS IV SOLN
INTRAVENOUS | Status: DC | PRN
  Administered 2016-12-13 (×2): via INTRAVENOUS

## 2016-12-13 MED ORDER — 0.9 % SODIUM CHLORIDE (POUR BTL) OPTIME
TOPICAL | Status: DC | PRN
  Administered 2016-12-13: 1000 mL

## 2016-12-13 MED ORDER — ONDANSETRON HCL 4 MG/2ML IJ SOLN
INTRAMUSCULAR | Status: DC | PRN
  Administered 2016-12-13: 4 mg via INTRAVENOUS

## 2016-12-13 MED ORDER — OXYCODONE HCL 5 MG PO TABS
5.0000 mg | ORAL_TABLET | Freq: Once | ORAL | Status: AC | PRN
  Administered 2016-12-13: 5 mg via ORAL

## 2016-12-13 MED ORDER — CHLORHEXIDINE GLUCONATE 4 % EX LIQD: 60.0000 mL | Freq: Once | CUTANEOUS | Status: DC

## 2016-12-13 MED ORDER — POVIDONE-IODINE 10 % EX SWAB: 2.0000 "application " | Freq: Once | CUTANEOUS | Status: DC

## 2016-12-13 MED ORDER — DEXMEDETOMIDINE HCL IN NACL 200 MCG/50ML IV SOLN
INTRAVENOUS | Status: DC | PRN
  Administered 2016-12-13: 8 ug via INTRAVENOUS
  Administered 2016-12-13 (×2): 12 ug via INTRAVENOUS
  Administered 2016-12-13: 8 ug via INTRAVENOUS

## 2016-12-13 MED ORDER — OXYCODONE HCL 5 MG PO TABS
ORAL_TABLET | ORAL | Status: AC
  Filled 2016-12-13: qty 1

## 2016-12-13 MED ORDER — DEXTROSE 5 % IV SOLN
3.0000 g | INTRAVENOUS | Status: AC
  Administered 2016-12-13: 3 g via INTRAVENOUS
  Filled 2016-12-13: qty 3000

## 2016-12-13 MED ORDER — DEXAMETHASONE SODIUM PHOSPHATE 10 MG/ML IJ SOLN
INTRAMUSCULAR | Status: DC | PRN
  Administered 2016-12-13: 10 mg via INTRAVENOUS

## 2016-12-13 MED ORDER — PROPOFOL 10 MG/ML IV BOLUS
INTRAVENOUS | Status: DC | PRN
  Administered 2016-12-13: 300 mg via INTRAVENOUS

## 2016-12-13 MED ORDER — LACTATED RINGERS IV SOLN
INTRAVENOUS | Status: DC
  Administered 2016-12-13: 15:00:00 via INTRAVENOUS

## 2016-12-13 MED ORDER — HYDROMORPHONE HCL 1 MG/ML IJ SOLN
INTRAMUSCULAR | Status: AC
  Filled 2016-12-13: qty 1

## 2016-12-13 MED ORDER — HYDROMORPHONE HCL 1 MG/ML IJ SOLN
0.2500 mg | INTRAMUSCULAR | Status: DC | PRN
  Administered 2016-12-13 (×4): 0.5 mg via INTRAVENOUS

## 2016-12-13 SURGICAL SUPPLY — 68 items
BANDAGE ACE 3X5.8 VEL STRL LF (GAUZE/BANDAGES/DRESSINGS) ×1 IMPLANT
BANDAGE ACE 4X5 VEL STRL LF (GAUZE/BANDAGES/DRESSINGS) IMPLANT
BIT DRILL 2X3.5 HAND QK RELEAS (BIT) IMPLANT
BIT DRILL 2X3.5 QUICK RELEASE (BIT) ×6
BIT DRILL MICR ACTRK 2 LNG PRF (BIT) IMPLANT
BLADE CLIPPER SURG (BLADE) IMPLANT
BNDG CMPR 75X41 PLY ABS (GAUZE/BANDAGES/DRESSINGS) ×1
BNDG CMPR 9X4 STRL LF SNTH (GAUZE/BANDAGES/DRESSINGS) ×1
BNDG ESMARK 4X9 LF (GAUZE/BANDAGES/DRESSINGS) ×3 IMPLANT
BNDG GAUZE ELAST 4 BULKY (GAUZE/BANDAGES/DRESSINGS) ×5 IMPLANT
BNDG STRETCH 4X75 NS LF (GAUZE/BANDAGES/DRESSINGS) ×2 IMPLANT
CORDS BIPOLAR (ELECTRODE) ×3 IMPLANT
COVER SURGICAL LIGHT HANDLE (MISCELLANEOUS) ×3 IMPLANT
CUFF TOURNIQUET SINGLE 18IN (TOURNIQUET CUFF) ×3 IMPLANT
CUFF TOURNIQUET SINGLE 24IN (TOURNIQUET CUFF) IMPLANT
DRAIN TLS ROUND 10FR (DRAIN) IMPLANT
DRAPE OEC MINIVIEW 54X84 (DRAPES) IMPLANT
DRAPE SURG 17X23 STRL (DRAPES) ×3 IMPLANT
DRILL MICRO ACUTRAK 2 LNG PROF (BIT) ×3
DRSG ADAPTIC 3X8 NADH LF (GAUZE/BANDAGES/DRESSINGS) ×2 IMPLANT
GAUZE SPONGE 4X4 12PLY STRL (GAUZE/BANDAGES/DRESSINGS) ×3 IMPLANT
GAUZE XEROFORM 1X8 LF (GAUZE/BANDAGES/DRESSINGS) ×3 IMPLANT
GLOVE BIOGEL M 8.0 STRL (GLOVE) ×3 IMPLANT
GLOVE SS BIOGEL STRL SZ 8 (GLOVE) ×1 IMPLANT
GLOVE SUPERSENSE BIOGEL SZ 8 (GLOVE) ×2
GOWN STRL REUS W/ TWL LRG LVL3 (GOWN DISPOSABLE) ×3 IMPLANT
GOWN STRL REUS W/ TWL XL LVL3 (GOWN DISPOSABLE) ×3 IMPLANT
GOWN STRL REUS W/TWL LRG LVL3 (GOWN DISPOSABLE) ×6
GOWN STRL REUS W/TWL XL LVL3 (GOWN DISPOSABLE) ×3
GUIDEWIRE ORTHO MICROSHT  ACUT (WIRE) ×2
GUIDEWIRE ORTHO MICROSHT .035 (WIRE) IMPLANT
GUIDEWIRE ORTHO MINI ACTK .045 (WIRE) ×3 IMPLANT
KIT BASIN OR (CUSTOM PROCEDURE TRAY) ×3 IMPLANT
KIT ROOM TURNOVER OR (KITS) ×3 IMPLANT
MANIFOLD NEPTUNE II (INSTRUMENTS) ×1 IMPLANT
NDL HYPO 25GX1X1/2 BEV (NEEDLE) IMPLANT
NEEDLE 22X1 1/2 (OR ONLY) (NEEDLE) IMPLANT
NEEDLE HYPO 25GX1X1/2 BEV (NEEDLE) ×3 IMPLANT
NS IRRIG 1000ML POUR BTL (IV SOLUTION) ×3 IMPLANT
PACK ORTHO EXTREMITY (CUSTOM PROCEDURE TRAY) ×3 IMPLANT
PAD ARMBOARD 7.5X6 YLW CONV (MISCELLANEOUS) ×3 IMPLANT
PAD CAST 3X4 CTTN HI CHSV (CAST SUPPLIES) ×1 IMPLANT
PAD CAST 4YDX4 CTTN HI CHSV (CAST SUPPLIES) ×3 IMPLANT
PADDING CAST COTTON 3X4 STRL (CAST SUPPLIES) ×3
PADDING CAST COTTON 4X4 STRL (CAST SUPPLIES) ×9
PLATE 1.3MM ROLANDO FRAC HOOK (Plate) ×2 IMPLANT
SCREW ACUTRAK 2 MICRO 18MM (Screw) ×2 IMPLANT
SCREW BONE LAG 2.3X12MM HEXA (Screw) IMPLANT
SCREW BONE NL 2.3X20MM HEXA (Screw) ×4 IMPLANT
SCREW LAG 2.3X12MM (Screw) ×3 IMPLANT
SCREW LOCK HEX MULTI 2.3X12 (Screw) ×2 IMPLANT
SCREW NONLOCK 2.3X13MM (Screw) ×2 IMPLANT
SCREW NONLOCK 2.3X20MM (Screw) ×12 IMPLANT
SCRUB BETADINE 4OZ XXX (MISCELLANEOUS) ×3 IMPLANT
SOL PREP POV-IOD 4OZ 10% (MISCELLANEOUS) ×3 IMPLANT
SPONGE LAP 4X18 X RAY DECT (DISPOSABLE) ×2 IMPLANT
SUT MNCRL AB 4-0 PS2 18 (SUTURE) ×3 IMPLANT
SUT PROLENE 3 0 PS 2 (SUTURE) IMPLANT
SUT PROLENE 4 0 PS 2 18 (SUTURE) ×3 IMPLANT
SUT VIC AB 3-0 FS2 27 (SUTURE) IMPLANT
SYR CONTROL 10ML LL (SYRINGE) ×2 IMPLANT
SYSTEM CHEST DRAIN TLS 7FR (DRAIN) IMPLANT
TOWEL OR 17X24 6PK STRL BLUE (TOWEL DISPOSABLE) ×3 IMPLANT
TOWEL OR 17X26 10 PK STRL BLUE (TOWEL DISPOSABLE) ×3 IMPLANT
TUBE CONNECTING 12'X1/4 (SUCTIONS) ×1
TUBE CONNECTING 12X1/4 (SUCTIONS) ×2 IMPLANT
TUBE EVACUATION TLS (MISCELLANEOUS) ×1 IMPLANT
WATER STERILE IRR 1000ML POUR (IV SOLUTION) ×1 IMPLANT

## 2016-12-13 NOTE — Anesthesia Procedure Notes (Signed)
Procedure Name: LMA Insertion Date/Time: 12/13/2016 5:18 PM Performed by: Izola Priceockfield, Stefan Markarian Walton Jr., CRNA Pre-anesthesia Checklist: Patient identified, Emergency Drugs available, Suction available, Patient being monitored and Timeout performed Patient Re-evaluated:Patient Re-evaluated prior to induction Oxygen Delivery Method: Circle system utilized Preoxygenation: Pre-oxygenation with 100% oxygen Induction Type: IV induction Ventilation: Mask ventilation without difficulty LMA: LMA inserted LMA Size: 5.0 Number of attempts: 1 Placement Confirmation: positive ETCO2 and breath sounds checked- equal and bilateral Dental Injury: Teeth and Oropharynx as per pre-operative assessment

## 2016-12-13 NOTE — H&P (Signed)
Mario QuailsBrandon Ingram is an 31 y.o. male.   Chief Complaint: right thumb Fx HPI: Patient presents for evaluation and treatment of the of their upper extremity predicament. The patient denies neck, back, chest or  abdominal pain. The patient notes that they have no lower extremity problems. The patients primary complaint is noted. We are planning surgical care pathway for the upper extremity.  Past Medical History:  Diagnosis Date  . Dyspnea    with exertion    Past Surgical History:  Procedure Laterality Date  . KNEE ARTHROSCOPY Right     History reviewed. No pertinent family history. Social History:  reports that he has been smoking cigarettes.  He has a 4.00 pack-year smoking history. he has never used smokeless tobacco. He reports that he drinks alcohol. He reports that he does not use drugs.  Allergies: No Known Allergies  Medications Prior to Admission  Medication Sig Dispense Refill  . HYDROcodone-acetaminophen (NORCO/VICODIN) 5-325 MG tablet Take 1 tablet every 6 (six) hours as needed by mouth. 20 tablet 0    Results for orders placed or performed during the hospital encounter of 12/13/16 (from the past 48 hour(s))  CBC     Status: None   Collection Time: 12/13/16  2:37 PM  Result Value Ref Range   WBC 8.6 4.0 - 10.5 K/uL   RBC 5.10 4.22 - 5.81 MIL/uL   Hemoglobin 15.8 13.0 - 17.0 g/dL   HCT 82.945.1 56.239.0 - 13.052.0 %   MCV 88.4 78.0 - 100.0 fL   MCH 31.0 26.0 - 34.0 pg   MCHC 35.0 30.0 - 36.0 g/dL   RDW 86.513.3 78.411.5 - 69.615.5 %   Platelets 179 150 - 400 K/uL   No results found.  Review of Systems  Respiratory: Negative.   Cardiovascular: Negative.   Gastrointestinal: Negative.   Genitourinary: Negative.     Blood pressure (!) 142/88, pulse 84, temperature 98.3 F (36.8 C), resp. rate 20, height 6' 5.99" (1.981 m), weight 129.3 kg (284 lb 16 oz), SpO2 99 %. Physical Exam  Right thumb fracture with displacement The patient is alert and oriented in no acute distress. The patient  complains of pain in the affected upper extremity.  The patient is noted to have a normal HEENT exam. Lung fields show equal chest expansion and no shortness of breath. Abdomen exam is nontender without distention. Lower extremity examination does not show any fracture dislocation or blood clot symptoms. Pelvis is stable and the neck and back are stable and nontender. Assessment/Plan We are planning surgery for your upper extremity. The risk and benefits of surgery to include risk of bleeding, infection, anesthesia,  damage to normal structures and failure of the surgery to accomplish its intended goals of relieving symptoms and restoring function have been discussed in detail. With this in mind we plan to proceed. I have specifically discussed with the patient the pre-and postoperative regime and the dos and don'ts and risk and benefits in great detail. Risk and benefits of surgery also include risk of dystrophy(CRPS), chronic nerve pain, failure of the healing process to go onto completion and other inherent risks of surgery The relavent the pathophysiology of the disease/injury process, as well as the alternatives for treatment and postoperative course of action has been discussed in great detail with the patient who desires to proceed.  We will do everything in our power to help you (the patient) restore function to the upper extremity. It is a pleasure to see this patient today.  Plan  right thumb ORIF as necessary  Karen ChafeGRAMIG Ingram,[name] M, MD 12/13/2016, 5:06 PM

## 2016-12-13 NOTE — Discharge Instructions (Signed)

## 2016-12-13 NOTE — Transfer of Care (Signed)
Immediate Anesthesia Transfer of Care Note  Patient: Mario Ingram  Procedure(s) Performed: OPEN REDUCTION INTERNAL FIXATION (ORIF) RIGHT THUMB FRACTURE (Right )  Patient Location: PACU  Anesthesia Type:General  Level of Consciousness: sedated and patient cooperative  Airway & Oxygen Therapy: Patient Spontanous Breathing and Patient connected to nasal cannula oxygen  Post-op Assessment: Report given to RN and Post -op Vital signs reviewed and stable  Post vital signs: Reviewed and stable  Last Vitals:  Vitals:   12/13/16 1422  BP: (!) 142/88  Pulse: 84  Resp: 20  Temp: 36.8 C  SpO2: 99%    Last Pain:  Vitals:   12/13/16 1454  PainSc: 4       Patients Stated Pain Goal: 3 (12/13/16 1454)  Complications: No apparent anesthesia complications

## 2016-12-13 NOTE — Anesthesia Preprocedure Evaluation (Signed)
Anesthesia Evaluation  Patient identified by MRN, date of birth, ID band Patient awake    Reviewed: Allergy & Precautions, NPO status , Patient's Chart, lab work & pertinent test results  Airway Mallampati: I  TM Distance: >3 FB Neck ROM: Full    Dental  (+) Teeth Intact   Pulmonary Current Smoker,    breath sounds clear to auscultation       Cardiovascular negative cardio ROS   Rhythm:Regular Rate:Normal     Neuro/Psych negative neurological ROS  negative psych ROS   GI/Hepatic negative GI ROS, Neg liver ROS,   Endo/Other  negative endocrine ROS  Renal/GU negative Renal ROS  negative genitourinary   Musculoskeletal negative musculoskeletal ROS (+)   Abdominal (+) + obese,   Peds negative pediatric ROS (+)  Hematology negative hematology ROS (+)   Anesthesia Other Findings   Reproductive/Obstetrics negative OB ROS                             Anesthesia Physical Anesthesia Plan  ASA: I  Anesthesia Plan: General   Post-op Pain Management:    Induction: Intravenous  PONV Risk Score and Plan: 2 and Treatment may vary due to age or medical condition, Ondansetron and Dexamethasone  Airway Management Planned: LMA  Additional Equipment:   Intra-op Plan:   Post-operative Plan: Extubation in OR  Informed Consent: I have reviewed the patients History and Physical, chart, labs and discussed the procedure including the risks, benefits and alternatives for the proposed anesthesia with the patient or authorized representative who has indicated his/her understanding and acceptance.   Dental advisory given  Plan Discussed with: CRNA  Anesthesia Plan Comments: (Patient prefers Gen anesth)        Anesthesia Quick Evaluation

## 2016-12-14 NOTE — Op Note (Deleted)
  The note originally documented on this encounter has been moved the the encounter in which it belongs.  

## 2016-12-14 NOTE — Op Note (Signed)
[name]:  Mario Ingram, Mario Ingram              ACCOUNT NO.:  [id number]  MEDICAL RECORD NO.:  [id number]  LOCATION:                               FACILITY:  [facility]  PHYSICIAN:  Dionne AnoWilliam M. Anetra Czerwinski, M.D.DATE OF BIRTH:  07/25/1985  DATE OF PROCEDURE: DATE OF DISCHARGE:  12/13/2016                              OPERATIVE REPORT   PREOPERATIVE DIAGNOSIS:  Comminuted complex right basal thumb joint fracture, intra-articular.  POSTOPERATIVE DIAGNOSIS:  Comminuted complex right basal thumb joint fracture, intra-articular.  PROCEDURE: 1. Open reduction internal fixation, intra-articular thumb fracture at     the base with an Acumed contoured plate and additional micro     Acutrak screws. 2. Six-view radiographic series. 3. Superficial radial nerve neurolysis extensive in nature. 4. First dorsal compartment release, right wrist.  SURGEON:  Dionne AnoWilliam M. Amanda PeaGramig, M.D.  ASSISTANT:  None.  COMPLICATIONS:  None.  ANESTHESIA:  General.  TOURNIQUET TIME:  Less than an hour.  INDICATIONS FOR THE PROCEDURE:  Pleasant male, who presents with the above-mentioned diagnosis I was referred the patient for definitive care given the comminuted nature and the need for hand surgical specialty.  I have counseled the patient in regard to risks and benefits of the surgery and other issues to remain to his upper extremity predicament. At this time, he desires to proceed.  OPERATIVE PROCEDURE:  The patient was seen by myself and Anesthesia, taken to the operative theater, underwent smooth induction of general anesthetic, prepped and draped in usual sterile fashion with Hibiclens scrub by myself, followed by 10 minutes surgical Betadine scrub.  Once this was completed, the patient then underwent a very careful and cautious approach to the arm with incision.  Time-out was called. Preoperative antibiotics were given.  An incision was made dorsally.  I made an incision after I fluoroscopied him and demonstrated no  ability to reduce this in a closed fashion for percutaneous fixation.  At this juncture, we made a dorsal utilitarian incision.  Dissection was carried down.  Superficial radial nerve was encased in a large amount of blood clot hematoma and I have thus performed a superficial radial nerve neurolysis extensive in nature.  Following this, I identified the extensor pollicis brevis and adductor pollicis longus.  I performed a first dorsal compartment release and free tissue up appropriately. Following this, I demonstrated the fracture site, incised the periosteum and then performed an open reduction internal fixation with a contoured Acumed plate.  I prebent the plate according to specification and achieved an excellent reduction.  This was very comminuted, complex fracture with greater than 5 parts.  Nevertheless, I was able to achieve excellent fixation.  The joint line was restored, length was restored, and all looked quite well.  With combination AO technique and careful interfrag screw purchase, I was able to align the fracture nicely.  I demonstrated full range of motion, excellent stability under fluoro, and 5-view/6-view radiographic series looked excellent.  Wound was closed with Vicryl over the periosteum.  I checked superficial radial nerve and all looked well.  We irrigated with liter of saline and closed the wound with Prolene.  Sensorcaine 20 mL with epinephrine was used for analgesia.  We will see  him back in 2 weeks, sutures out, cast him for up to 5 to 6 weeks until he looks stable.  I have discussed with him do's and don'ts and other issues to remain his predicament and all questions have been encouraged and answered.     Dionne AnoWilliam M. Amanda PeaGramig, M.D.   ______________________________ Dionne AnoWilliam M. Amanda PeaGramig, M.D.    [facility]/MEDQ  D:  12/13/2016  T:  12/14/2016  Job:  [id number]

## 2016-12-14 NOTE — Anesthesia Postprocedure Evaluation (Signed)
Anesthesia Post Note  Patient: Mario Ingram  Procedure(s) Performed: OPEN REDUCTION INTERNAL FIXATION (ORIF) RIGHT THUMB FRACTURE (Right )     Patient location during evaluation: PACU Anesthesia Type: General Level of consciousness: awake and alert Pain management: pain level controlled Vital Signs Assessment: post-procedure vital signs reviewed and stable Respiratory status: spontaneous breathing, nonlabored ventilation, respiratory function stable and patient connected to nasal cannula oxygen Cardiovascular status: blood pressure returned to baseline and stable Postop Assessment: no apparent nausea or vomiting Anesthetic complications: no    Last Vitals:  Vitals:   12/13/16 2000 12/13/16 2015  BP: 132/75 128/82  Pulse: 72 66  Resp: 12 11  Temp: (!) 36.4 C   SpO2: 97% 97%    Last Pain:  Vitals:   12/13/16 2015  PainSc: 4                  Abdulah Iqbal S

## 2016-12-19 ENCOUNTER — Encounter (HOSPITAL_COMMUNITY): Payer: Self-pay | Admitting: Orthopedic Surgery

## 2017-01-16 NOTE — Addendum Note (Signed)
Addendum  created 01/16/17 2219 by Dorris SinghGreen, Lynann Demetrius, MD   Intraprocedure Staff edited

## 2017-02-28 ENCOUNTER — Ambulatory Visit (HOSPITAL_COMMUNITY)
Admission: EM | Admit: 2017-02-28 | Discharge: 2017-02-28 | Disposition: A | Discharge status: Home / Self Care | Payer: Self-pay | Attending: Family Medicine | Admitting: Family Medicine

## 2017-02-28 ENCOUNTER — Encounter (HOSPITAL_COMMUNITY): Payer: Self-pay | Admitting: Emergency Medicine

## 2017-02-28 DIAGNOSIS — S39012A Strain of muscle, fascia and tendon of lower back, initial encounter: Secondary | ICD-10-CM

## 2017-02-28 MED ORDER — KETOROLAC TROMETHAMINE 60 MG/2ML IM SOLN
60.0000 mg | Freq: Once | INTRAMUSCULAR | Status: AC
  Administered 2017-02-28: 60 mg via INTRAMUSCULAR

## 2017-02-28 MED ORDER — CYCLOBENZAPRINE HCL 10 MG PO TABS: 10.0000 mg | ORAL_TABLET | Freq: Two times a day (BID) | ORAL | 0 refills | Status: AC | PRN

## 2017-02-28 MED ORDER — PREDNISONE 20 MG PO TABS: 40.0000 mg | ORAL_TABLET | Freq: Every day | ORAL | 0 refills | Status: AC

## 2017-02-28 MED ORDER — DICLOFENAC SODIUM 75 MG PO TBEC: 75.0000 mg | DELAYED_RELEASE_TABLET | Freq: Two times a day (BID) | ORAL | 0 refills | Status: AC

## 2017-02-28 MED ORDER — KETOROLAC TROMETHAMINE 60 MG/2ML IM SOLN
INTRAMUSCULAR | Status: AC
Start: 2017-02-28 — End: 2017-02-28
  Filled 2017-02-28: qty 2

## 2017-02-28 NOTE — ED Notes (Signed)
Requesting work note

## 2017-02-28 NOTE — ED Provider Notes (Signed)
[facility]    CSN: [id number] Arrival date & time: 02/28/17  1031     History   Chief Complaint Chief Complaint  Patient presents with  . Back Pain    HPI Zhi Geier is a 32 y.o. male presenting with acute back pain. Pain began yesterday. Started after he bent over and stood back up. Sensation of muscle locking up. Has had this happen a couple times in past. Denies numbness, tingling, radiation into legs. Denies fevers, saddle anesthesia,  Loss of bowel/bladder control.   HPI  Past Medical History:  Diagnosis Date  . Dyspnea    with exertion    There are no active problems to display for this patient.   Past Surgical History:  Procedure Laterality Date  . KNEE ARTHROSCOPY Right   . OPEN REDUCTION INTERNAL FIXATION (ORIF) METACARPAL Right 12/13/2016   Procedure: OPEN REDUCTION INTERNAL FIXATION (ORIF) RIGHT THUMB FRACTURE;  Surgeon: Dominica Severin, MD;  Location: [facility] OR;  Service: Orthopedics;  Laterality: Right;  60 MINS       Home Medications    Prior to Admission medications   Medication Sig Start Date End Date Taking? Authorizing Provider  cyclobenzaprine (FLEXERIL) 10 MG tablet Take 1 tablet (10 mg total) by mouth 2 (two) times daily as needed for up to 7 days for muscle spasms. 02/28/17 03/07/17  Lucio Litsey C, PA-C  diclofenac (VOLTAREN) 75 MG EC tablet Take 1 tablet (75 mg total) by mouth 2 (two) times daily for 10 days. 02/28/17 03/10/17  Tewana Bohlen C, PA-C  predniSONE (DELTASONE) 20 MG tablet Take 2 tablets (40 mg total) by mouth daily for 4 days. 02/28/17 03/04/17  Emmily Pellegrin, Junius Creamer, PA-C    Family History History reviewed. No pertinent family history.  Social History Social History   Tobacco Use  . Smoking status: Current Every Day Smoker    Packs/day: 0.40    Years: 10.00    Pack years: 4.00    Types: Cigarettes  . Smokeless tobacco: Never Used  Substance Use Topics  . Alcohol use: Yes    Comment: "barely"  . Drug use: No       Allergies   Patient has no known allergies.   Review of Systems Review of Systems  Constitutional: Negative for fever.  Respiratory: Negative for shortness of breath.   Cardiovascular: Negative for chest pain.  Gastrointestinal: Negative for abdominal pain, nausea and vomiting.  Genitourinary: Negative for decreased urine volume and difficulty urinating.  Musculoskeletal: Positive for back pain, gait problem and myalgias. Negative for neck pain.  Skin: Negative for color change.  Neurological: Negative for dizziness, syncope, weakness, light-headedness, numbness and headaches.     Physical Exam Triage Vital Signs ED Triage Vitals  Enc Vitals Group     BP 02/28/17 1104 127/64     Pulse Rate 02/28/17 1104 74     Resp 02/28/17 1104 20     Temp 02/28/17 1104 97.6 F (36.4 C)     Temp Source 02/28/17 1104 Oral     SpO2 02/28/17 1104 100 %     Weight --      Height --      Head Circumference --      Peak Flow --      Pain Score 02/28/17 1105 10     Pain Loc --      Pain Edu? --      Excl. in GC? --    No data found.  Updated Vital Signs BP  127/64 (BP Location: Left Arm)   Pulse 74   Temp 97.6 F (36.4 C) (Oral)   Resp 20   SpO2 100%   Visual Acuity Right Eye Distance:   Left Eye Distance:   Bilateral Distance:    Right Eye Near:   Left Eye Near:    Bilateral Near:     Physical Exam  Constitutional: He appears well-developed and well-nourished.  Patient lying in prone position  HENT:  Head: Normocephalic and atraumatic.  Eyes: Conjunctivae are normal.  Neck: Neck supple.  Cardiovascular: Normal rate and regular rhythm.  No murmur heard. Pulmonary/Chest: Effort normal and breath sounds normal. No respiratory distress.  Abdominal: Soft. There is no tenderness.  Musculoskeletal: He exhibits no edema.  Patient favoring right side by leaning away, does straighten out when asked. Non tender to thoracic and lumbar spine midline. Tenderness to palpation of  right lumbar musculature. Negative SLR bilaterally.   Neurological: He is alert.  Skin: Skin is warm and dry.  Psychiatric: He has a normal mood and affect.  Nursing note and vitals reviewed.    UC Treatments / Results  Labs (all labs ordered are listed, but only abnormal results are displayed) Labs Reviewed - No data to display  EKG  EKG Interpretation None       Radiology No results found.  Procedures Procedures (including critical care time)  Medications Ordered in UC Medications  ketorolac (TORADOL) injection 60 mg (not administered)     Initial Impression / Assessment and Plan / UC Course  I have reviewed the triage vital signs and the nursing notes.  Pertinent labs & imaging results that were available during my care of the patient were reviewed by me and considered in my medical decision making (see chart for details).     Likely muscle strain of lumbar muscles, appears to happen occasionally, seems similar to past episodes. Toradol injection today. 4 days of prednisone, flexeril PRN and advised Tylenol/ibuprofen or diclofenac. Discussed strict return precautions. Patient verbalized understanding and is agreeable with plan.   Final Clinical Impressions(s) / UC Diagnoses   Final diagnoses:  Strain of lumbar region, initial encounter    ED Discharge Orders        Ordered    predniSONE (DELTASONE) 20 MG tablet  Daily     02/28/17 1138    cyclobenzaprine (FLEXERIL) 10 MG tablet  2 times daily PRN     02/28/17 1138    diclofenac (VOLTAREN) 75 MG EC tablet  2 times daily     02/28/17 1138       Controlled Substance Prescriptions [place] Controlled Substance Registry consulted? Not Applicable   Lew DawesWieters, Tyrisha Benninger C, [place]-C 02/28/17 1432

## 2017-02-28 NOTE — ED Notes (Signed)
Work note discussed with hallie, pa and provided

## 2017-02-28 NOTE — Discharge Instructions (Signed)
We gave you an injection of Toradol (anti-inflammatory).   Pain is most likely a muscular strain. Pain may take 1-2 weeks to resolve.  Take prednisone daily for 4 days.  Use anti-inflammatories for pain/swelling. You may take up to 800 mg Ibuprofen every 8 hours with food. You may supplement Ibuprofen with Tylenol [phone] mg every 8 hours. Alternatively to ibuprofen you may take diclofenac and Tylenol.   Flexeril 10 mg provided, will cause drowsiness- avoid driving while using this.   Please alternate using ice/heating pad to help with any discomfort 3-4 times a day  Please follow up here or with your primary care if symptoms not improving in 2 weeks. Please return sooner if pain changes, worsens, develop numbness, tingling.

## 2017-02-28 NOTE — ED Triage Notes (Signed)
PT C/O: back pain onset yest .... sts he was dressing his son when he felt a pull.   Pain increases w/activity   TAKING MEDS: Oxycodone and a muscle relaxer  A&O x4... NAD... Ambulatory

## 2017-03-09 DIAGNOSIS — M25541 Pain in joints of right hand: Secondary | ICD-10-CM | POA: Insufficient documentation

## 2017-03-09 DIAGNOSIS — S62309A Unspecified fracture of unspecified metacarpal bone, initial encounter for closed fracture: Secondary | ICD-10-CM | POA: Insufficient documentation

## 2017-07-11 ENCOUNTER — Ambulatory Visit: Payer: Self-pay | Admitting: Podiatry

## 2017-07-17 ENCOUNTER — Ambulatory Visit: Payer: Managed Care, Other (non HMO) | Admitting: Podiatry

## 2017-07-17 ENCOUNTER — Encounter: Payer: Self-pay | Admitting: Podiatry

## 2017-07-17 VITALS — BP 110/70 | HR 72 | Resp 16

## 2017-07-17 DIAGNOSIS — L600 Ingrowing nail: Secondary | ICD-10-CM | POA: Diagnosis not present

## 2017-07-17 DIAGNOSIS — Q828 Other specified congenital malformations of skin: Secondary | ICD-10-CM

## 2017-07-17 MED ORDER — NEOMYCIN-POLYMYXIN-HC 1 % OT SOLN: OTIC | 1 refills | Status: DC

## 2017-07-17 NOTE — Patient Instructions (Signed)

## 2017-07-17 NOTE — Progress Notes (Signed)
  Subjective:  Patient ID: Mario Ingram, male    DOB: 1985-08-20,  MRN: [id number] HPI Chief Complaint  Patient presents with  . Callouses    Plantar foot right - small, callused areas x 4, trims them down but always comes back, hurts when walking when thick  . Toe Pain    Hallux bilateral - lateral borders, ingrown   . New Patient (Initial Visit)    32 y.o. male presents with the above complaint.   ROS: Denies fever chills nausea vomiting muscle aches pains calf pain back pain chest pain shortness of breath.  Past Medical History:  Diagnosis Date  . Dyspnea    with exertion   Past Surgical History:  Procedure Laterality Date  . KNEE ARTHROSCOPY Right   . OPEN REDUCTION INTERNAL FIXATION (ORIF) METACARPAL Right 12/13/2016   Procedure: OPEN REDUCTION INTERNAL FIXATION (ORIF) RIGHT THUMB FRACTURE;  Surgeon: Dominica SeverinGramig, William, MD;  Location: [facility];  Service: Orthopedics;  Laterality: Right;  60 MINS    Current Outpatient Medications:  .  NEOMYCIN-POLYMYXIN-HYDROCORTISONE (CORTISPORIN) 1 % SOLN OTIC solution, Apply 1-2 drops to toe BID after soaking, Disp: 10 mL, Rfl: 1  No Known Allergies Review of Systems Objective:   Vitals:   07/17/17 1448  BP: 110/70  Pulse: 72  Resp: 16    General: Well developed, nourished, in no acute distress, alert and oriented x3   Dermatological: Skin is warm, dry and supple bilateral. Nails x 10 are well maintained; remaining integument appears unremarkable at this time. There are no open sores, no preulcerative lesions, no rash or signs of infection present.  Sharp incurvated toenails fibular border of the hallux bilateral mild erythema no cellulitis drainage or odor.  4 solitary porokeratotic lesions to to the medial aspect of the foot to to the lateral aspect of the foot left.  Vascular: Dorsalis Pedis artery and Posterior Tibial artery pedal pulses are 2/4 bilateral with immedate capillary fill time. Pedal hair growth present. No varicosities  and no lower extremity edema present bilateral.   Neruologic: Grossly intact via light touch bilateral. Vibratory intact via tuning fork bilateral. Protective threshold with Semmes Wienstein monofilament intact to all pedal sites bilateral. Patellar and Achilles deep tendon reflexes 2+ bilateral. No Babinski or clonus noted bilateral.   Musculoskeletal: No gross boney pedal deformities bilateral. No pain, crepitus, or limitation noted with foot and ankle range of motion bilateral. Muscular strength 5/5 in all groups tested bilateral.  Gait: Unassisted, Nonantalgic.    Radiographs:  None taken  Assessment & Plan:   Assessment: Porokeratosis x4 left foot.  Ingrown toenails fibular border hallux bilateral.  Plan: Discussed etiology pathology conservative versus surgical therapies.  Debrided all 4 reactive hyper keratomas plantar aspect left foot.  Chemical matrixectomy's were performed to the fibular border of the hallux bilaterally.  These were performed successfully after 3 cc of a 50-50 mixture of Marcaine plain lidocaine plain was infiltrated in a hallux bilaterally.  He tolerated procedure well without complications.  We dispensed both oral and written home-going instructions for the care and soaking of his foot as well as a prescription for Cortisporin Otic to be applied twice a day after soaking.  I will follow-up with him in 2 weeks.     Adlee Paar T. CaleraHyatt, [place] Dakota[place]PM

## 2017-08-02 ENCOUNTER — Ambulatory Visit (INDEPENDENT_AMBULATORY_CARE_PROVIDER_SITE_OTHER): Payer: Self-pay

## 2017-08-02 DIAGNOSIS — L600 Ingrowing nail: Secondary | ICD-10-CM

## 2017-08-02 NOTE — Patient Instructions (Addendum)
Place 1/4 cup of epsom salts in a quart of warm tap water.  Submerge your foot or feet in the solution and soak for 20 minutes.  This soak should be done twice a day.  Next, remove your foot or feet from solution, blot dry the affected area. Apply ointment and cover if instructed by your doctor.   IF YOUR SKIN BECOMES IRRITATED WHILE USING THESE INSTRUCTIONS, IT IS OKAY TO SWITCH TO  WHITE VINEGAR AND WATER.  As another alternative soak, you may use antibacterial soap and water.  Monitor for any signs/symptoms of infection. Call the office immediately if any occur or go directly to the emergency room. Call with any questions/concerns.      Please continue to soak until there is no pain, no redness and no drainage. Bandage on during the day and off at night. Reduce usage of antibiotic drops to 1 drop once daily per nail. Once you are no longer soaking the toe you can stop using the antibiotic drops.

## 2017-08-03 NOTE — Progress Notes (Signed)
Patient presents s/p partial nail avulsion bilateral hallux nails DOS 6.11.19. He denies pain at this time and states he is continuing to soak his toes daily. [facility]    Noted well healing avulsed nail borders. No redness, no swelling or blistering. No drainage.    Advised to continue with soaking if needed and utilize prescribed drops if needed. Instructions were given in detail verbally and written to the patient. He is to follow up with any acute symptom changes.

## 2017-10-07 ENCOUNTER — Ambulatory Visit (INDEPENDENT_AMBULATORY_CARE_PROVIDER_SITE_OTHER): Payer: Managed Care, Other (non HMO)

## 2017-10-07 ENCOUNTER — Ambulatory Visit (HOSPITAL_COMMUNITY)
Admission: EM | Admit: 2017-10-07 | Discharge: 2017-10-07 | Disposition: A | Discharge status: Home / Self Care | Payer: Managed Care, Other (non HMO) | Attending: Family Medicine | Admitting: Family Medicine

## 2017-10-07 ENCOUNTER — Encounter (HOSPITAL_COMMUNITY): Payer: Self-pay | Admitting: Emergency Medicine

## 2017-10-07 DIAGNOSIS — S62633A Displaced fracture of distal phalanx of left middle finger, initial encounter for closed fracture: Secondary | ICD-10-CM

## 2017-10-07 MED ORDER — MELOXICAM 7.5 MG PO TABS: 7.5000 mg | ORAL_TABLET | Freq: Every day | ORAL | 0 refills | Status: DC

## 2017-10-07 NOTE — Discharge Instructions (Signed)
As discussed, fracture to your left fingertip around the joint.  Start Mobic as directed.  Ice compress, finger splint.  Follow-up with orthopedic for further evaluation and management needed.

## 2017-10-07 NOTE — ED Triage Notes (Signed)
Pt states two weeks ago the top knuckle of his L middle finger was swollen. Pt unsure of injury. Pt has full ROM of fingers.

## 2017-10-07 NOTE — ED Provider Notes (Signed)
[facility]    CSN: [id number] Arrival date & time: 10/07/17  1052     History   Chief Complaint Chief Complaint  Patient presents with  . Finger Pain    HPI Mario Ingram is a 32 y.o. male.   32 year old male comes in for 2 to 3-week history of pain and swelling to the left DIP joint.  He is unsure of injury, but states he played football around the time of symptom onset, and wonders if during football he injured his finger.  He denies any numbness, tingling.  States able to move fingers without difficulty.  Has not done anything for the symptoms.     Past Medical History:  Diagnosis Date  . Dyspnea    with exertion    Patient Active Problem List   Diagnosis Date Noted  . Fracture of metacarpal bone 03/09/2017  . Pain in thumb joint with movement, right 03/09/2017    Past Surgical History:  Procedure Laterality Date  . KNEE ARTHROSCOPY Right   . OPEN REDUCTION INTERNAL FIXATION (ORIF) METACARPAL Right 12/13/2016   Procedure: OPEN REDUCTION INTERNAL FIXATION (ORIF) RIGHT THUMB FRACTURE;  Surgeon: Dominica Severin, MD;  Location: [facility];  Service: Orthopedics;  Laterality: Right;  60 MINS       Home Medications    Prior to Admission medications   Medication Sig Start Date End Date Taking? Authorizing Provider  meloxicam (MOBIC) 7.5 MG tablet Take 1 tablet (7.5 mg total) by mouth daily. 10/07/17   Cathie Hoops, Ariel Dimitri V, PA-C  NEOMYCIN-POLYMYXIN-HYDROCORTISONE (CORTISPORIN) 1 % SOLN OTIC solution Apply 1-2 drops to toe BID after soaking Patient not taking: Reported on 10/07/2017 07/17/17   Elinor Parkinson, DPM    Family History No family history on file.  Social History Social History   Tobacco Use  . Smoking status: Current Every Day Smoker    Packs/day: 0.40    Years: 10.00    Pack years: 4.00    Types: Cigarettes  . Smokeless tobacco: Never Used  Substance Use Topics  . Alcohol use: Yes    Comment: "barely"  . Drug use: No     Allergies   Patient has no  known allergies.   Review of Systems Review of Systems  Reason unable to perform ROS: See HPI as above.     Physical Exam Triage Vital Signs ED Triage Vitals  Enc Vitals Group     BP 10/07/17 1120 (!) 101/50     Pulse Rate 10/07/17 1119 74     Resp 10/07/17 1119 14     Temp 10/07/17 1119 97.9 F (36.6 C)     Temp src --      SpO2 10/07/17 1119 98 %     Weight --      Height --      Head Circumference --      Peak Flow --      Pain Score --      Pain Loc --      Pain Edu? --      Excl. in GC? --    No data found.  Updated Vital Signs BP (!) 101/50   Pulse 74   Temp 97.9 F (36.6 C)   Resp 14   SpO2 98%   Physical Exam  Constitutional: He is oriented to person, place, and time. He appears well-developed and well-nourished. No distress.  HENT:  Head: Normocephalic and atraumatic.  Eyes: Pupils are equal, round, and reactive to light. Conjunctivae are  normal.  Musculoskeletal:  Swelling to the left third DIP joint without erythema, warmth, contusion.  Diffuse tenderness to palpation of the DIP joint, no other tenderness to palpation of the finger, hand.  Full range of motion.  Strength on middle finger 3 out of 5.  Sensation intact and equal bilaterally.  Cap refill less than 2 seconds.  Neurological: He is alert and oriented to person, place, and time.  Skin: He is not diaphoretic.     UC Treatments / Results  Labs (all labs ordered are listed, but only abnormal results are displayed) Labs Reviewed - No data to display  EKG None  Radiology Dg Finger Middle Left  Result Date: 10/07/2017 CLINICAL DATA:  Per pt: left middle finger pain, football injury to the left middle finger, two weeks ago. Pain in the left middle finger is not getting better. Pain is the left middle finger, distally. EXAM: LEFT MIDDLE FINGER 2+V COMPARISON:  None. FINDINGS: There is an acute fracture along the dorsal aspect of the distal phalanx of the middle finger. Fracture line extends to  the articular surface. Associated soft tissue swelling and deformity. IMPRESSION: Fracture the base of the distal phalanx of the third digit. Electronically Signed   By: Norva Pavlov M.D.   On: 10/07/2017 12:30    Procedures Procedures (including critical care time)  Medications Ordered in UC Medications - No data to display  Initial Impression / Assessment and Plan / UC Course  I have reviewed the triage vital signs and the nursing notes.  Pertinent labs & imaging results that were available during my care of the patient were reviewed by me and considered in my medical decision making (see chart for details).    Discussed x-ray results with patient.  Finger splint.  NSAIDs, ice compress.  Patient to follow-up with orthopedics for further evaluation and management needed.  Final Clinical Impressions(s) / UC Diagnoses   Final diagnoses:  Closed displaced fracture of distal phalanx of left middle finger, initial encounter    ED Prescriptions    Medication Sig Dispense Auth. Provider   meloxicam (MOBIC) 7.5 MG tablet Take 1 tablet (7.5 mg total) by mouth daily. 15 tablet Threasa Alpha, [place] 10/07/17 1721

## 2017-11-20 ENCOUNTER — Emergency Department (HOSPITAL_BASED_OUTPATIENT_CLINIC_OR_DEPARTMENT_OTHER)
Admission: EM | Admit: 2017-11-20 | Discharge: 2017-11-21 | Disposition: A | Discharge status: Home / Self Care | Payer: Managed Care, Other (non HMO) | Attending: Emergency Medicine | Admitting: Emergency Medicine

## 2017-11-20 ENCOUNTER — Encounter (HOSPITAL_BASED_OUTPATIENT_CLINIC_OR_DEPARTMENT_OTHER): Payer: Self-pay

## 2017-11-20 ENCOUNTER — Other Ambulatory Visit: Payer: Self-pay

## 2017-11-20 ENCOUNTER — Emergency Department (HOSPITAL_BASED_OUTPATIENT_CLINIC_OR_DEPARTMENT_OTHER): Payer: Managed Care, Other (non HMO)

## 2017-11-20 DIAGNOSIS — J069 Acute upper respiratory infection, unspecified: Secondary | ICD-10-CM | POA: Diagnosis not present

## 2017-11-20 DIAGNOSIS — Z79899 Other long term (current) drug therapy: Secondary | ICD-10-CM | POA: Insufficient documentation

## 2017-11-20 DIAGNOSIS — F17210 Nicotine dependence, cigarettes, uncomplicated: Secondary | ICD-10-CM | POA: Insufficient documentation

## 2017-11-20 DIAGNOSIS — B9789 Other viral agents as the cause of diseases classified elsewhere: Secondary | ICD-10-CM

## 2017-11-20 DIAGNOSIS — R05 Cough: Secondary | ICD-10-CM | POA: Diagnosis present

## 2017-11-20 NOTE — ED Provider Notes (Signed)
[facility] EMERGENCY DEPARTMENT Provider Note   CSN: [id number] Arrival date & time: 11/20/17  2233     History   Chief Complaint Chief Complaint  Patient presents with  . Cough    HPI Mario Ingram is a 32 y.o. male.  HPI Patient presents to the emergency department with cough, nasal congestion, nasal drainage, sore throat, body aches and fever over the last 24 hours.  The patient states that his symptoms seem to get worse today.  The patient states that he did not take any medications prior to arrival.  Patient states that when he lays down tonight he seems to be more feverish.  Patient denies chest pain, shortness of breath, nausea, vomiting, weakness, dizziness, headache, blurred vision, abdominal pain, back pain, near-syncope or syncope. Past Medical History:  Diagnosis Date  . Dyspnea    with exertion    Patient Active Problem List   Diagnosis Date Noted  . Fracture of metacarpal bone 03/09/2017  . Pain in thumb joint with movement, right 03/09/2017    Past Surgical History:  Procedure Laterality Date  . KNEE ARTHROSCOPY Right   . OPEN REDUCTION INTERNAL FIXATION (ORIF) METACARPAL Right 12/13/2016   Procedure: OPEN REDUCTION INTERNAL FIXATION (ORIF) RIGHT THUMB FRACTURE;  Surgeon: Dominica Severin, MD;  Location: [facility];  Service: Orthopedics;  Laterality: Right;  60 MINS        Home Medications    Prior to Admission medications   Medication Sig Start Date End Date Taking? Authorizing Provider  meloxicam (MOBIC) 7.5 MG tablet Take 1 tablet (7.5 mg total) by mouth daily. 10/07/17   Cathie Hoops, Amy V, PA-C  NEOMYCIN-POLYMYXIN-HYDROCORTISONE (CORTISPORIN) 1 % SOLN OTIC solution Apply 1-2 drops to toe BID after soaking Patient not taking: Reported on 10/07/2017 07/17/17   Elinor Parkinson, DPM    Family History No family history on file.  Social History Social History   Tobacco Use  . Smoking status: Current Every Day Smoker    Packs/day: 0.40    Years: 10.00      Pack years: 4.00    Types: Cigarettes  . Smokeless tobacco: Never Used  Substance Use Topics  . Alcohol use: Yes    Comment: rare  . Drug use: No     Allergies   Patient has no known allergies.   Review of Systems Review of Systems  All other systems negative except as documented in the HPI. All pertinent positives and negatives as reviewed in the HPI. Physical Exam Updated Vital Signs BP 132/85 (BP Location: Left Arm)   Pulse (!) 107   Temp 98.9 F (37.2 C) (Oral)   Resp 20   Ht 6\' 6"  (1.981 m)   Wt (!) 137.4 kg   SpO2 98%   BMI 35.00 kg/m   Physical Exam  Constitutional: He is oriented to person, place, and time. He appears well-developed and well-nourished. No distress.  HENT:  Head: Normocephalic and atraumatic.  Mouth/Throat: Oropharynx is clear and moist.  Eyes: Pupils are equal, round, and reactive to light.  Neck: Normal range of motion. Neck supple.  Cardiovascular: Normal rate, regular rhythm and normal heart sounds. Exam reveals no gallop and no friction rub.  No murmur heard. Pulmonary/Chest: Effort normal and breath sounds normal. No respiratory distress. He has no wheezes.  Abdominal: Soft. Bowel sounds are normal. He exhibits no distension. There is no tenderness.  Neurological: He is alert and oriented to person, place, and time. He exhibits normal muscle tone. Coordination normal.  Skin: Skin is warm and dry. Capillary refill takes less than 2 seconds. No rash noted. No erythema.  Psychiatric: He has a normal mood and affect. His behavior is normal.  Nursing note and vitals reviewed.    ED Treatments / Results  Labs (all labs ordered are listed, but only abnormal results are displayed) Labs Reviewed - No data to display  EKG None  Radiology No results found.  Procedures Procedures (including critical care time)  Medications Ordered in ED Medications - No data to display   Initial Impression / Assessment and Plan / ED Course  I  have reviewed the triage vital signs and the nursing notes.  Pertinent labs & imaging results that were available during my care of the patient were reviewed by me and considered in my medical decision making (see chart for details).    Patient most likely has a viral URI with cough.  Told to return here as needed.  Patient will be given treatment for his symptoms.  Patient is advised to increase his fluid intake and rest as much as possible.   Final Clinical Impressions(s) / ED Diagnoses   Final diagnoses:  None    ED Discharge Orders    None       Kyra Manges 11/20/17 2329    Tilden Fossa, MD 11/21/17 1208

## 2017-11-20 NOTE — ED Triage Notes (Signed)
C/o flu like sx x 2 days-NAD-steady gait 

## 2017-11-21 MED ORDER — GUAIFENESIN ER 1200 MG PO TB12: 1.0000 | ORAL_TABLET | Freq: Two times a day (BID) | ORAL | 0 refills | Status: DC

## 2017-11-21 MED ORDER — ACETAMINOPHEN-CODEINE 120-12 MG/5ML PO SOLN: 10.0000 mL | ORAL | 0 refills | Status: DC | PRN

## 2017-11-21 MED ORDER — IBUPROFEN 800 MG PO TABS: 800.0000 mg | ORAL_TABLET | Freq: Three times a day (TID) | ORAL | 0 refills | Status: DC | PRN

## 2017-11-21 NOTE — Discharge Instructions (Addendum)
Return here as needed.  Rest as much as possible and increase her fluid intake Tylenol and the Motrin for fever.

## 2019-01-21 ENCOUNTER — Encounter (HOSPITAL_COMMUNITY): Payer: Self-pay

## 2019-01-21 ENCOUNTER — Ambulatory Visit (HOSPITAL_COMMUNITY)
Admission: EM | Admit: 2019-01-21 | Discharge: 2019-01-21 | Disposition: A | Discharge status: Home / Self Care | Payer: Managed Care, Other (non HMO) | Attending: Family Medicine | Admitting: Family Medicine

## 2019-01-21 ENCOUNTER — Other Ambulatory Visit: Payer: Self-pay

## 2019-01-21 DIAGNOSIS — M545 Low back pain, unspecified: Secondary | ICD-10-CM

## 2019-01-21 MED ORDER — DEXAMETHASONE SODIUM PHOSPHATE 10 MG/ML IJ SOLN
INTRAMUSCULAR | Status: AC
  Filled 2019-01-21: qty 1

## 2019-01-21 MED ORDER — DEXAMETHASONE SODIUM PHOSPHATE 10 MG/ML IJ SOLN
10.0000 mg | Freq: Once | INTRAMUSCULAR | Status: AC
  Administered 2019-01-21: 11:00:00 10 mg via INTRAMUSCULAR

## 2019-01-21 MED ORDER — METHOCARBAMOL 500 MG PO TABS: 500.0000 mg | ORAL_TABLET | Freq: Two times a day (BID) | ORAL | 0 refills | Status: DC

## 2019-01-21 MED ORDER — DICLOFENAC SODIUM 75 MG PO TBEC: 75.0000 mg | DELAYED_RELEASE_TABLET | Freq: Two times a day (BID) | ORAL | 0 refills | Status: DC

## 2019-01-21 NOTE — Discharge Instructions (Signed)

## 2019-01-21 NOTE — ED Provider Notes (Signed)
[place]   [id number] 01/21/19 Arrival Time: 2376  ASSESSMENT & PLAN:  1. Acute left-sided low back pain without sciatica     Able to ambulate here and hemodynamically stable. No indication for imaging of back at this time given no trauma and normal neurological exam. Discussed.   Meds ordered this encounter  Medications  . methocarbamol (ROBAXIN) 500 MG tablet    Sig: Take 1 tablet (500 mg total) by mouth 2 (two) times daily.    Dispense:  20 tablet    Refill:  0  . diclofenac (VOLTAREN) 75 MG EC tablet    Sig: Take 1 tablet (75 mg total) by mouth 2 (two) times daily.    Dispense:  14 tablet    Refill:  0  . dexamethasone (DECADRON) injection 10 mg    See AVS for d/c instructions. Encourage ROM/movement as tolerated.  Recommend: Follow-up Information    [facility].   Why: If worsening or failing to improve as anticipated. Contact information: [address] [place] [phone]          Reviewed expectations re: course of current medical issues. Questions answered. Outlined signs and symptoms indicating need for more acute intervention. Patient verbalized understanding. After Visit Summary given.   SUBJECTIVE: History from: patient.  Mario Ingram is a 33 y.o. male who presents with complaint of fairly persistent left sided lower back discomfort. Onset gradual. First noted approx 10 d ago. Injury/trama: no. History of back problems requiring medical care: occasional. Pain described as aching and without radiation. Aggravating factors: certain movements and prolonged walking/standing. Alleviating factors: rest. Progressive LE weakness or saddle anesthesia: none. Extremity sensation changes or weakness: none. Ambulatory without difficulty. Normal bowel/bladder habits: yes; without urinary retention. Normal PO intake without n/v. No associated abdominal pain/n/v. Self treatment: has  acetaminophen, with minimal relief.  Reports no chronic steroid use, fevers, IV drug use, or recent back surgeries or procedures.  ROS: As per HPI. All other systems negative.   OBJECTIVE:  Vitals:   01/21/19 1022 01/21/19 1025  BP:  134/85  Pulse:  78  Resp:  19  Temp:  98.3 F (36.8 C)  TempSrc:  Oral  SpO2:  99%  Weight: 127 kg     General appearance: alert; no distress HEENT: [place]; AT Neck: supple with FROM; without midline tenderness CV: RRR Lungs: unlabored respirations; symmetrical air entry Abdomen: soft, non-tender; non-distended Back: mild  and poorly localized tenderness to palpation over left lumbar paraspinal musculature; FROM at waist; bruising: none; without midline tenderness Extremities: without edema; symmetrical without gross deformities; normal ROM of bilateral LE Skin: warm and dry Neurologic: normal gait; normal reflexes of bilateral LE; normal sensation of bilateral LE; normal strength of bilateral LE Psychological: alert and cooperative; normal mood and affect    No Known Allergies  Past Medical History:  Diagnosis Date  . Dyspnea    with exertion   Social History   Socioeconomic History  . Marital status: Married    Spouse name: Not on file  . Number of children: Not on file  . Years of education: Not on file  . Highest education level: Not on file  Occupational History  . Not on file  Tobacco Use  . Smoking status: Current Every Day Smoker    Packs/day: 0.40    Years: 10.00    Pack years: 4.00    Types: Cigarettes  . Smokeless tobacco: Never Used  Substance  and Sexual Activity  . Alcohol use: Yes    Comment: rare  . Drug use: No  . Sexual activity: Yes  Other Topics Concern  . Not on file  Social History Narrative  . Not on file   Social Determinants of Health   Financial Resource Strain:   . Difficulty of Paying Living Expenses: Not on file  Food Insecurity:   . Worried About Programme researcher, broadcasting/film/video in the Last Year: Not on  file  . Ran Out of Food in the Last Year: Not on file  Transportation Needs:   . Lack of Transportation (Medical): Not on file  . Lack of Transportation (Non-Medical): Not on file  Physical Activity:   . Days of Exercise per Week: Not on file  . Minutes of Exercise per Session: Not on file  Stress:   . Feeling of Stress : Not on file  Social Connections:   . Frequency of Communication with Friends and Family: Not on file  . Frequency of Social Gatherings with Friends and Family: Not on file  . Attends Religious Services: Not on file  . Active Member of Clubs or Organizations: Not on file  . Attends Banker Meetings: Not on file  . Marital Status: Not on file  Intimate Partner Violence:   . Fear of Current or Ex-Partner: Not on file  . Emotionally Abused: Not on file  . Physically Abused: Not on file  . Sexually Abused: Not on file   Family History  Problem Relation Age of Onset  . Healthy Mother   . Diabetes Father    Past Surgical History:  Procedure Laterality Date  . KNEE ARTHROSCOPY Right   . OPEN REDUCTION INTERNAL FIXATION (ORIF) METACARPAL Right 12/13/2016   Procedure: OPEN REDUCTION INTERNAL FIXATION (ORIF) RIGHT THUMB FRACTURE;  Surgeon: Dominica Severin, MD;  Location: [facility];  Service: Orthopedics;  Laterality: Right;  60 MINS     Mardella Layman, MD 01/21/19 1036

## 2019-01-21 NOTE — ED Triage Notes (Signed)
Pt. Presents back pain that he has had for 10 days now, he has been self medicating. States he has a hx of back pain.

## 2019-03-06 IMAGING — DX DG CHEST 2V
2 series · 2 of 2 positions shown · non-contrast
Comparison: None.

CLINICAL DATA: 32 y/o M; fever, sternal chest pain, cough with
yellow sputum, body aches, and no energy for 2 days.

EXAM:
CHEST - 2 VIEW

[chest pa]
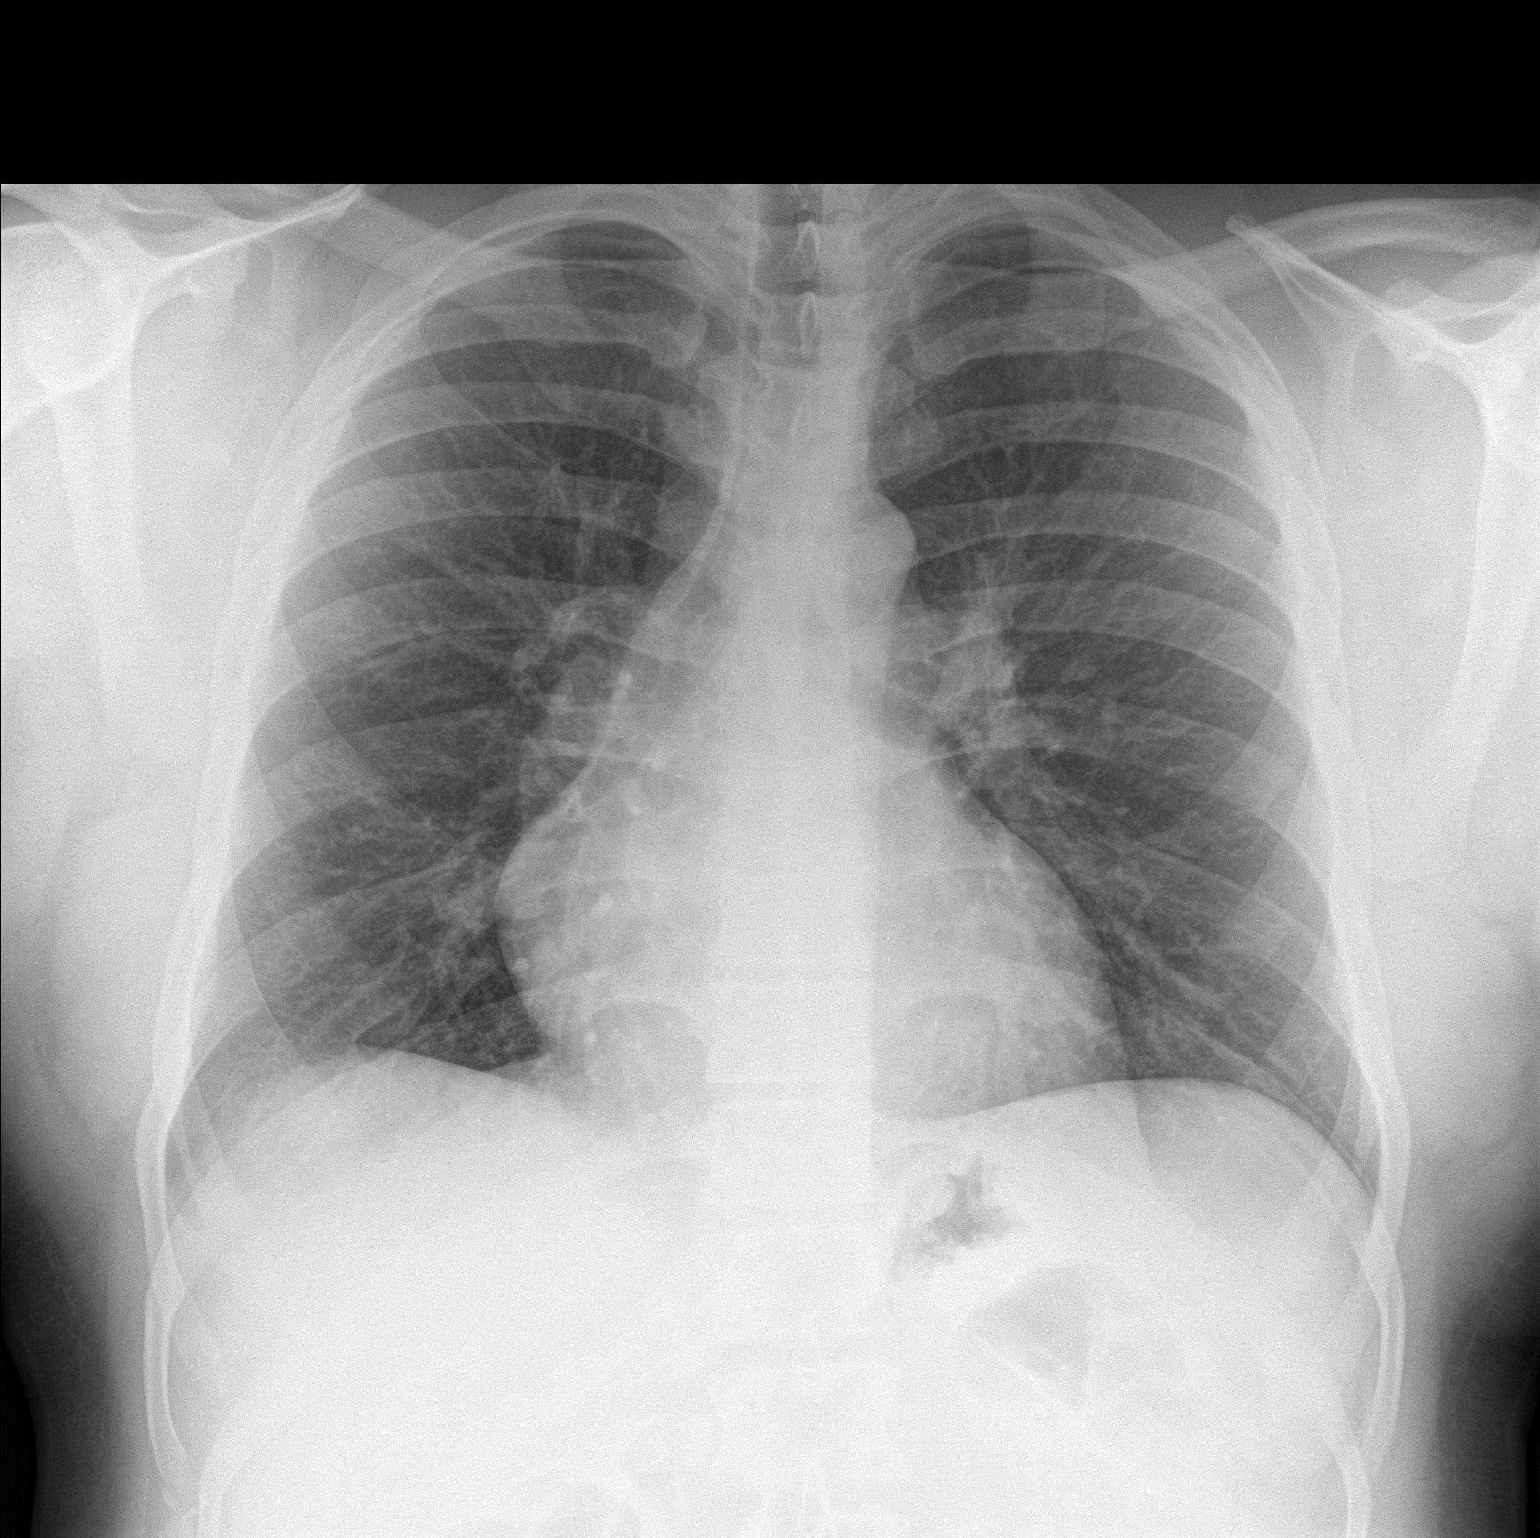

[chest lat]
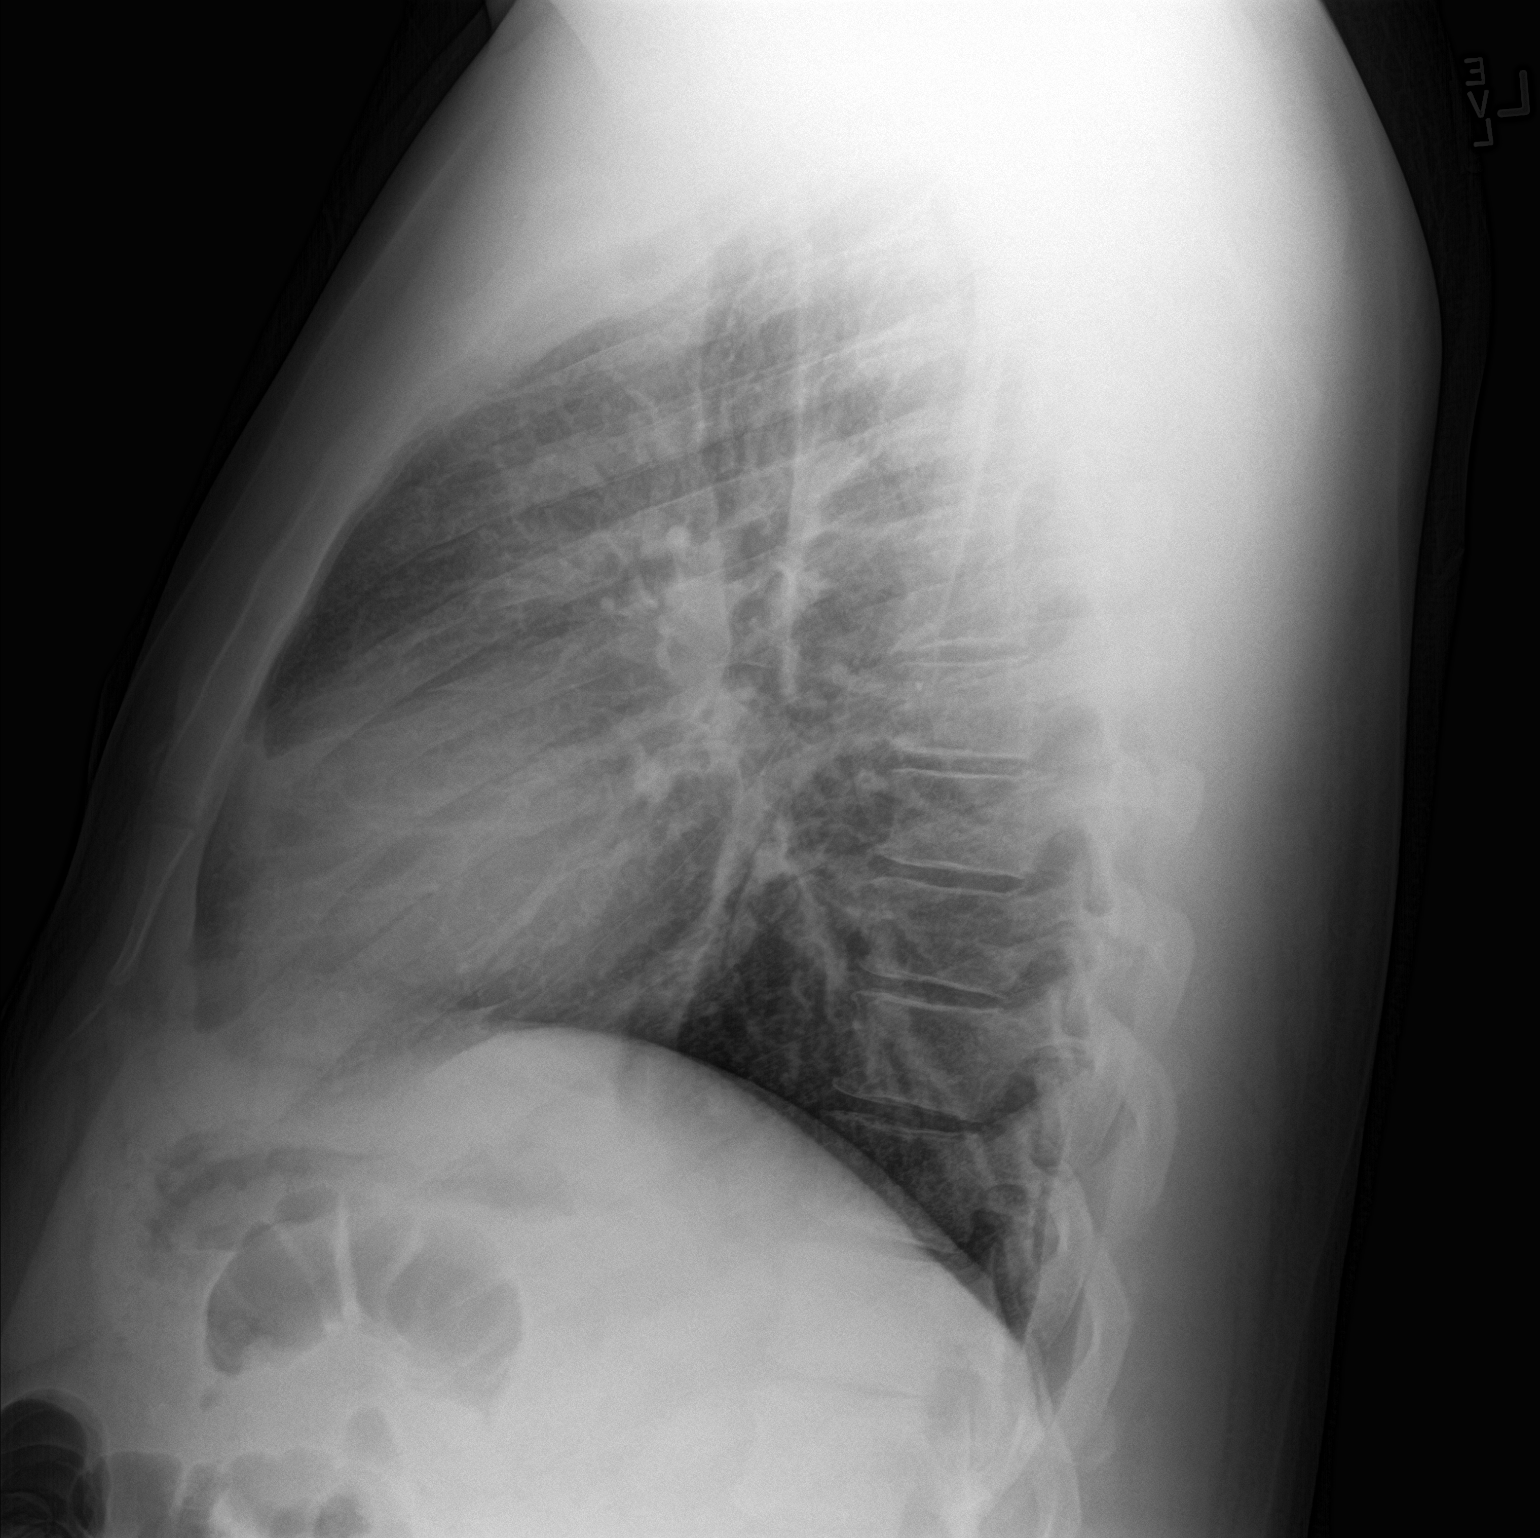

[2 of 2 positions shown; findings below may reference images not displayed]

FINDINGS: Normal cardiac silhouette given projection and technique. No
consolidation, effusion, or pneumothorax. Bones are unremarkable.
IMPRESSION: No acute pulmonary process identified.

By: Edgar Efrain Wilchez M.D.

## 2019-04-07 ENCOUNTER — Ambulatory Visit: Payer: Self-pay | Attending: Internal Medicine

## 2019-04-07 DIAGNOSIS — Z20822 Contact with and (suspected) exposure to covid-19: Secondary | ICD-10-CM

## 2019-04-08 LAB — NOVEL CORONAVIRUS, NAA: SARS-CoV-2, NAA: NOT DETECTED

## 2020-09-23 ENCOUNTER — Encounter (HOSPITAL_COMMUNITY): Payer: Self-pay

## 2020-09-23 ENCOUNTER — Other Ambulatory Visit: Payer: Self-pay

## 2020-09-23 ENCOUNTER — Ambulatory Visit (HOSPITAL_COMMUNITY)
Admission: EM | Admit: 2020-09-23 | Discharge: 2020-09-23 | Disposition: A | Discharge status: Home / Self Care | Payer: Self-pay | Attending: Family Medicine | Admitting: Family Medicine

## 2020-09-23 DIAGNOSIS — S39012A Strain of muscle, fascia and tendon of lower back, initial encounter: Secondary | ICD-10-CM

## 2020-09-23 MED ORDER — METHOCARBAMOL 500 MG PO TABS: 500.0000 mg | ORAL_TABLET | Freq: Two times a day (BID) | ORAL | 0 refills | Status: DC | PRN

## 2020-09-23 MED ORDER — DEXAMETHASONE SODIUM PHOSPHATE 10 MG/ML IJ SOLN
10.0000 mg | Freq: Once | INTRAMUSCULAR | Status: AC
  Administered 2020-09-23: 10 mg via INTRAMUSCULAR

## 2020-09-23 MED ORDER — DEXAMETHASONE SODIUM PHOSPHATE 10 MG/ML IJ SOLN
INTRAMUSCULAR | Status: AC
  Filled 2020-09-23: qty 1

## 2020-09-23 NOTE — ED Provider Notes (Signed)
[facility]    CSN: [id number] Arrival date & time: 09/23/20  9211      History   Chief Complaint Chief Complaint  Patient presents with   Back Pain    HPI Mario Ingram is a 35 y.o. male.   Patient here today with about a week of bilateral low back soreness, spasms with movement.  He states it initially started after he was bending over lifting some heavy items earlier in the week.  States the pain is improved with rest, worsened with bending, lifting, twisting.  Denies numbness, tingling, weakness, bowel or bladder incontinence, abdominal pain, known history of back trauma.  Taking Aleve with little relief using a back brace.   Past Medical History:  Diagnosis Date   Dyspnea    with exertion    Patient Active Problem List   Diagnosis Date Noted   Fracture of metacarpal bone 03/09/2017   Pain in thumb joint with movement, right 03/09/2017    Past Surgical History:  Procedure Laterality Date   KNEE ARTHROSCOPY Right    OPEN REDUCTION INTERNAL FIXATION (ORIF) METACARPAL Right 12/13/2016   Procedure: OPEN REDUCTION INTERNAL FIXATION (ORIF) RIGHT THUMB FRACTURE;  Surgeon: Dominica Severin, MD;  Location: [facility];  Service: Orthopedics;  Laterality: Right;  60 MINS       Home Medications    Prior to Admission medications   Medication Sig Start Date End Date Taking? Authorizing Provider  diclofenac (VOLTAREN) 75 MG EC tablet Take 1 tablet (75 mg total) by mouth 2 (two) times daily. 01/21/19   Mardella Layman, MD  methocarbamol (ROBAXIN) 500 MG tablet Take 1 tablet (500 mg total) by mouth 2 (two) times daily as needed for muscle spasms. Do not drink alcohol or drive while taking this medication.  May cause drowsiness. 09/23/20   Particia Nearing, PA-C    Family History Family History  Problem Relation Age of Onset   Healthy Mother    Diabetes Father     Social History Social History   Tobacco Use   Smoking status: Every Day    Packs/day: 0.40     Years: 10.00    Pack years: 4.00    Types: Cigarettes   Smokeless tobacco: Never  Vaping Use   Vaping Use: Never used  Substance Use Topics   Alcohol use: Yes    Comment: rare   Drug use: No     Allergies   Patient has no known allergies.   Review of Systems Review of Systems Per HPI  Physical Exam Triage Vital Signs ED Triage Vitals  Enc Vitals Group     BP 09/23/20 0835 139/82     Pulse Rate 09/23/20 0835 64     Resp 09/23/20 0835 20     Temp 09/23/20 0835 98.2 F (36.8 C)     Temp Source 09/23/20 0835 Oral     SpO2 09/23/20 0835 95 %     Weight --      Height --      Head Circumference --      Peak Flow --      Pain Score 09/23/20 0833 8     Pain Loc --      Pain Edu? --      Excl. in [place]? --    No data found.  Updated Vital Signs BP 139/82 (BP Location: Left Arm)   Pulse 64   Temp 98.2 F (36.8 C) (Oral)   Resp 20   SpO2 95%  Visual Acuity Right Eye Distance:   Left Eye Distance:   Bilateral Distance:    Right Eye Near:   Left Eye Near:    Bilateral Near:     Physical Exam Vitals and nursing note reviewed.  Constitutional:      Appearance: Normal appearance.  HENT:     Head: Atraumatic.  Eyes:     Extraocular Movements: Extraocular movements intact.     Conjunctiva/sclera: Conjunctivae normal.  Cardiovascular:     Rate and Rhythm: Normal rate and regular rhythm.  Pulmonary:     Effort: Pulmonary effort is normal.     Breath sounds: Normal breath sounds.  Abdominal:     General: Bowel sounds are normal. There is no distension.     Palpations: Abdomen is soft.     Tenderness: There is no abdominal tenderness. There is no right CVA tenderness, left CVA tenderness or guarding.  Musculoskeletal:        General: Tenderness present. No swelling or deformity. Normal range of motion.     Cervical back: Normal range of motion and neck supple.     Comments: No midline spinal tenderness to palpation diffusely.  Bilateral lumbar paraspinal  muscles tender to palpation and spasm.  Good range of motion in all directions of lumbar spine.  Normal gait.  Negative straight leg raise bilateral lower extremities.  Skin:    General: Skin is warm and dry.     Findings: No bruising or erythema.  Neurological:     General: No focal deficit present.     Mental Status: He is oriented to person, place, and time.     Motor: No weakness.     Gait: Gait normal.     Comments: Bilateral lower extremities neurovascularly intact  Psychiatric:        Mood and Affect: Mood normal.        Thought Content: Thought content normal.        Judgment: Judgment normal.     UC Treatments / Results  Labs (all labs ordered are listed, but only abnormal results are displayed) Labs Reviewed - No data to display  EKG   Radiology No results found.  Procedures Procedures (including critical care time)  Medications Ordered in UC Medications  dexamethasone (DECADRON) injection 10 mg (10 mg Intramuscular Given 09/23/20 0901)    Initial Impression / Assessment and Plan / UC Course  I have reviewed the triage vital signs and the nursing notes.  Pertinent labs & imaging results that were available during my care of the patient were reviewed by me and considered in my medical decision making (see chart for details).     Consistent with lumbar strain, will treat with IM Decadron, Robaxin as needed with precautions.  Rest and other home care reviewed.  Follow-up with worsening symptoms.  Final Clinical Impressions(s) / UC Diagnoses   Final diagnoses:  Strain of lumbar region, initial encounter   Discharge Instructions   None    ED Prescriptions     Medication Sig Dispense Auth. Provider   methocarbamol (ROBAXIN) 500 MG tablet Take 1 tablet (500 mg total) by mouth 2 (two) times daily as needed for muscle spasms. Do not drink alcohol or drive while taking this medication.  May cause drowsiness. 20 tablet Particia Nearing, [place]      PDMP  not reviewed this encounter.   Particia Nearing, [place] 09/23/20 1049

## 2020-09-23 NOTE — ED Triage Notes (Signed)
Pt reports lower back pain  1 week. Back brace gives no relief.

## 2021-05-16 ENCOUNTER — Ambulatory Visit (HOSPITAL_COMMUNITY)
Admission: EM | Admit: 2021-05-16 | Discharge: 2021-05-16 | Disposition: A | Discharge status: Home / Self Care | Payer: Self-pay | Attending: Family Medicine | Admitting: Family Medicine

## 2021-05-16 ENCOUNTER — Encounter (HOSPITAL_COMMUNITY): Payer: Self-pay

## 2021-05-16 DIAGNOSIS — M5416 Radiculopathy, lumbar region: Secondary | ICD-10-CM

## 2021-05-16 MED ORDER — DEXAMETHASONE SODIUM PHOSPHATE 10 MG/ML IJ SOLN
INTRAMUSCULAR | Status: AC
  Filled 2021-05-16: qty 1

## 2021-05-16 MED ORDER — METHOCARBAMOL 500 MG PO TABS: 500.0000 mg | ORAL_TABLET | Freq: Two times a day (BID) | ORAL | 0 refills | Status: DC

## 2021-05-16 MED ORDER — DEXAMETHASONE SODIUM PHOSPHATE 10 MG/ML IJ SOLN
10.0000 mg | Freq: Once | INTRAMUSCULAR | Status: AC
  Administered 2021-05-16: 10 mg via INTRAMUSCULAR

## 2021-05-16 NOTE — ED Provider Notes (Signed)
?[facility] ? ? ? ?CSN: [id number] ?Arrival date & time: 05/16/21  1408 ? ? ?  ? ?History   ?Chief Complaint ?Chief Complaint  ?Patient presents with  ? Back Pain  ? ? ?HPI ?Mario Ingram is a 36 y.o. male.  ? ?HPI ?Patient presents today for evaluation of low lumbar back pain. 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  Patient has been seen for the same problem several times here at urgent care and reports this is a steroid shot typically resolves the issue of low back pain present.  He endorses occasional radiation to his lower legs with ambulation.  He also reports that he achieves pain relief with the muscle relaxant.  He denies any other focal symptoms ? ?Past Medical History:  ?Diagnosis Date  ? Dyspnea   ? with exertion  ? ? ?Patient Active Problem List  ? Diagnosis Date Noted  ? Fracture of metacarpal bone 03/09/2017  ? Pain in thumb joint with movement, right 03/09/2017  ? ? ?Past Surgical History:  ?Procedure Laterality Date  ? KNEE ARTHROSCOPY Right   ? OPEN REDUCTION INTERNAL FIXATION (ORIF) METACARPAL Right 12/13/2016  ? Procedure: OPEN REDUCTION INTERNAL FIXATION (ORIF) RIGHT THUMB FRACTURE;  Surgeon: Roseanne Kaufman, MD;  Location: [facility];  Service: Orthopedics;  Laterality: Right;  60 MINS  ? ? ? ? ? ?Home Medications   ? ?Prior to Admission medications   ?Medication Sig Start Date End Date Taking? Authorizing Provider  ?methocarbamol (ROBAXIN) 500 MG tablet Take 1 tablet (500 mg total) by mouth 2 (two) times daily. 05/16/21  Yes Scot Jun, FNP  ?diclofenac (VOLTAREN) 75 MG EC tablet Take 1 tablet (75 mg total) by mouth 2 (two) times daily. 01/21/19   Vanessa Kick, MD  ? ? ?Family History ?Family History  ?Problem Relation Age of Onset  ? Healthy Mother   ? Diabetes Father   ? ? ?Social History ?Social History  ? ?Tobacco Use  ? Smoking status: Every  Day  ?  Packs/day: 0.40  ?  Years: 10.00  ?  Pack years: 4.00  ?  Types: Cigarettes  ? Smokeless tobacco: Never  ?Vaping Use  ? Vaping Use: Never used  ?Substance Use Topics  ? Alcohol use: Yes  ?  Comment: rare  ? Drug use: No  ? ? ? ?Allergies   ?Patient has no known allergies. ? ? ?Review of Systems ?Pertinent negatives listed in HPI  ? ? ?Physical Exam ?Triage Vital Signs ?ED Triage Vitals  ?Enc Vitals Group  ?   BP 05/16/21 1602 129/83  ?   Pulse Rate 05/16/21 1602 70  ?   Resp 05/16/21 1602 18  ?   Temp 05/16/21 1602 97.8 ?F (36.6 ?C)  ?   Temp Source 05/16/21 1602 Oral  ?   SpO2 05/16/21 1602 94 %  ?   Weight --   ?   Height --   ?   Head Circumference --   ?   Peak Flow --   ?   Pain Score 05/16/21 1601 9  ?   Pain Loc --   ?   Pain Edu? --   ?   Excl. in [place]? --   ? ?No data found. ? ?Updated Vital Signs ?BP 129/83 (BP Location: Right Arm)   Pulse 70   Temp 97.8 ?F (36.6 ?  C) (Oral)   Resp 18   SpO2 94%  ? ?Visual Acuity ?Right Eye Distance:   ?Left Eye Distance:   ?Bilateral Distance:   ? ?Right Eye Near:   ?Left Eye Near:    ?Bilateral Near:    ? ?Physical Exam ?Constitutional:   ?   Appearance: Normal appearance.  ?Cardiovascular:  ?   Rate and Rhythm: Normal rate and regular rhythm.  ?Pulmonary:  ?   Effort: Pulmonary effort is normal.  ?Musculoskeletal:  ?   Cervical back: Normal range of motion.  ?   Lumbar back: Tenderness and bony tenderness present. Positive left straight leg raise test.  ?Neurological:  ?   Mental Status: He is alert.  ? ? ? ?UC Treatments / Results  ?Labs ?(all labs ordered are listed, but only abnormal results are displayed) ?Labs Reviewed - No data to display ? ?EKG ? ? ?Radiology ?No results found. ? ?Procedures ?Procedures (including critical care time) ? ?Medications Ordered in UC ?Medications  ?dexamethasone (DECADRON) injection 10 mg (10 mg Intramuscular Given 05/16/21 1638)  ? ? ?Initial Impression / Assessment and Plan / UC Course  ?I have reviewed the triage vital  signs and the nursing notes. ? ?Pertinent labs & imaging results that were available during my care of the patient were reviewed by me and considered in my medical decision making (see chart for details). ? ?  ?Acute lumbar radiculopathy with no focal neurological symptoms.  Patient received Decadron IM here in clinic.  Discharged home.  Robaxin.  Also discussed at the first sign of low back pain stop taking over-the-counter naproxen to help reduce the risk of pain related to the level stated past today.  Patient verbalized understanding and agreement plan. ? ?Final diagnoses:  ?Acute lumbar radiculopathy  ? ?Discharge Instructions   ?None ?  ? ?ED Prescriptions   ? ? Medication Sig Dispense Auth. Provider  ? methocarbamol (ROBAXIN) 500 MG tablet Take 1 tablet (500 mg total) by mouth 2 (two) times daily. 20 tablet Scot Jun, FNP  ? ?  ? ?PDMP not reviewed this encounter. ?  ?Scot Jun, FNP ?05/16/21 1701 ? ?

## 2021-05-16 NOTE — ED Triage Notes (Signed)
Pt presents with severe back pain since waking up this morning. ?

## 2021-12-05 ENCOUNTER — Emergency Department (HOSPITAL_COMMUNITY): Payer: BC Managed Care – PPO

## 2021-12-05 ENCOUNTER — Other Ambulatory Visit: Payer: Self-pay

## 2021-12-05 ENCOUNTER — Ambulatory Visit (HOSPITAL_COMMUNITY)
Admission: EM | Admit: 2021-12-05 | Discharge: 2021-12-05 | Disposition: A | Discharge status: Home / Self Care | Payer: BC Managed Care – PPO | Attending: Internal Medicine | Admitting: Internal Medicine

## 2021-12-05 ENCOUNTER — Emergency Department (HOSPITAL_COMMUNITY)
Admission: EM | Admit: 2021-12-05 | Discharge: 2021-12-05 | Disposition: A | Discharge status: Home / Self Care | Payer: BC Managed Care – PPO | Attending: Emergency Medicine | Admitting: Emergency Medicine

## 2021-12-05 ENCOUNTER — Encounter (HOSPITAL_COMMUNITY): Payer: Self-pay

## 2021-12-05 DIAGNOSIS — Z716 Tobacco abuse counseling: Secondary | ICD-10-CM | POA: Diagnosis not present

## 2021-12-05 DIAGNOSIS — F17210 Nicotine dependence, cigarettes, uncomplicated: Secondary | ICD-10-CM | POA: Diagnosis not present

## 2021-12-05 DIAGNOSIS — R0789 Other chest pain: Secondary | ICD-10-CM | POA: Insufficient documentation

## 2021-12-05 DIAGNOSIS — R079 Chest pain, unspecified: Secondary | ICD-10-CM

## 2021-12-05 LAB — CBC
HCT: 44.4 % (ref 39.0–52.0)
Hemoglobin: 14.8 g/dL (ref 13.0–17.0)
MCH: 30 pg (ref 26.0–34.0)
MCHC: 33.3 g/dL (ref 30.0–36.0)
MCV: 89.9 fL (ref 80.0–100.0)
Platelets: 209 10*3/uL (ref 150–400)
RBC: 4.94 MIL/uL (ref 4.22–5.81)
RDW: 13.2 % (ref 11.5–15.5)
WBC: 7.4 10*3/uL (ref 4.0–10.5)
nRBC: 0 % (ref 0.0–0.2)

## 2021-12-05 LAB — TROPONIN I (HIGH SENSITIVITY)
Troponin I (High Sensitivity): 5 ng/L (ref <18)
Troponin I (High Sensitivity): 4 ng/L (ref <18)

## 2021-12-05 LAB — BASIC METABOLIC PANEL
Anion gap: 8 (ref 5–15)
BUN: 9 mg/dL (ref 6–20)
CO2: 27 mmol/L (ref 22–32)
Calcium: 9.3 mg/dL (ref 8.9–10.3)
Chloride: 106 mmol/L (ref 98–111)
Creatinine, Ser: 1.48 mg/dL — ABNORMAL HIGH (ref 0.61–1.24)
GFR, Estimated: >60 mL/min (ref >60)
Glucose, Bld: 105 mg/dL — ABNORMAL HIGH (ref 70–99)
Potassium: 3.7 mmol/L (ref 3.5–5.1)
Sodium: 141 mmol/L (ref 135–145)

## 2021-12-05 MED ORDER — LIDOCAINE 5 % EX PTCH: 1.0000 | MEDICATED_PATCH | Freq: Every day | CUTANEOUS | 0 refills | Status: DC | PRN

## 2021-12-05 MED ORDER — ACETAMINOPHEN 325 MG PO TABS: 650.0000 mg | ORAL_TABLET | Freq: Four times a day (QID) | ORAL | 0 refills | Status: AC | PRN

## 2021-12-05 MED ORDER — ACETAMINOPHEN 500 MG PO TABS
1000.0000 mg | ORAL_TABLET | Freq: Once | ORAL | Status: AC
  Administered 2021-12-05: 1000 mg via ORAL
  Filled 2021-12-05: qty 2

## 2021-12-05 MED ORDER — IBUPROFEN 800 MG PO TABS: 800.0000 mg | ORAL_TABLET | Freq: Three times a day (TID) | ORAL | 0 refills | Status: DC | PRN

## 2021-12-05 MED ORDER — LIDOCAINE 5 % EX PTCH
1.0000 | MEDICATED_PATCH | CUTANEOUS | Status: DC
  Administered 2021-12-05: 1 via TRANSDERMAL
  Filled 2021-12-05: qty 1

## 2021-12-05 NOTE — Discharge Instructions (Signed)
It was a pleasure caring for you today in the emergency department. ° °Please return to the emergency department for any worsening or worrisome symptoms. ° ° °

## 2021-12-05 NOTE — ED Notes (Signed)
Patient is being discharged from the Urgent Care and sent to the Emergency Department via POV . Per Dr. Windy Carina, patient is in need of higher level of care due to lt sided chest pain. Patient is aware and verbalizes understanding of plan of care.  Vitals:   12/05/21 0816  BP: 131/75  Pulse: (!) 54  Resp: 18  Temp: 98 F (36.7 C)  SpO2: 97%

## 2021-12-05 NOTE — ED Provider Notes (Signed)
Seen briefly in triage. 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 No associated diaphoresis or palpitations or nausea.  EKG shows flattened t waves.   VS reassuring.  He smokes tobacco  I have asked him to proceed to the ER for further evaluation   Barrett Henle, MD 12/05/21 [phone]

## 2021-12-05 NOTE — ED Triage Notes (Signed)
Pt c/o lt sided chest pain since 2am. States hard to describe but the pain feels like a knot. Denies any other sx's.

## 2021-12-05 NOTE — ED Triage Notes (Signed)
Pt. 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

## 2021-12-05 NOTE — ED Provider Notes (Signed)
[facility] EMERGENCY DEPARTMENT Provider Note   CSN: [id number] Arrival date & time: 12/05/21  0831     History {Add pertinent medical, surgical, social history, OB history to HPI:1} Chief Complaint  Patient presents with  . Chest Pain    Mario Ingram is a 36 y.o. male.  Patient as above with significant medical history as below, including exertional dyspnea who presents to the ED with complaint of chest pain. Onset of the pain was around 0200 this AM while driving, has been constant since the onset. Unable to identify alleviating or exacerbating factors, not a/w dib, diaphoresis, lightheadedness/syncope, no trauma. No meds PTA. No hx of the same. Report pain feels like a "lump" on his left axilla. No cardiac hx that he is aware of. No hx DM or HTN. He is obese. Does not follow with a pcp. Otherwise has no acute complaints.      Past Medical History:  Diagnosis Date  . Dyspnea    with exertion    Past Surgical History:  Procedure Laterality Date  . KNEE ARTHROSCOPY Right   . OPEN REDUCTION INTERNAL FIXATION (ORIF) METACARPAL Right 12/13/2016   Procedure: OPEN REDUCTION INTERNAL FIXATION (ORIF) RIGHT THUMB FRACTURE;  Surgeon: Roseanne Kaufman, MD;  Location: [facility];  Service: Orthopedics;  Laterality: Right;  2 MINS     The history is provided by the patient. No language interpreter was used.  Chest Pain Associated symptoms: no abdominal pain, no cough, no dysphagia, no fever, no headache, no nausea, no palpitations, no shortness of breath and no vomiting        Home Medications Prior to Admission medications   Medication Sig Start Date End Date Taking? Authorizing Provider  diclofenac (VOLTAREN) 75 MG EC tablet Take 1 tablet (75 mg total) by mouth 2 (two) times daily. Patient not taking: Reported on 12/05/2021 01/21/19   Vanessa Kick, MD  methocarbamol (ROBAXIN) 500 MG tablet Take 1 tablet (500 mg total) by mouth 2 (two) times daily. Patient not  taking: Reported on 12/05/2021 05/16/21   Scot Jun, FNP      Allergies    Patient has no known allergies.    Review of Systems   Review of Systems  Constitutional:  Negative for chills and fever.  HENT:  Negative for facial swelling and trouble swallowing.   Eyes:  Negative for photophobia and visual disturbance.  Respiratory:  Negative for cough and shortness of breath.   Cardiovascular:  Positive for chest pain. Negative for palpitations.  Gastrointestinal:  Negative for abdominal pain, nausea and vomiting.  Endocrine: Negative for polydipsia and polyuria.  Genitourinary:  Negative for difficulty urinating and hematuria.  Musculoskeletal:  Negative for gait problem and joint swelling.  Skin:  Negative for pallor and rash.  Neurological:  Negative for syncope and headaches.  Psychiatric/Behavioral:  Negative for agitation and confusion.     Physical Exam Updated Vital Signs BP 130/76 (BP Location: Left Arm)   Pulse (!) 52   Temp (!) 97.5 F (36.4 C) (Oral)   Resp 18   Ht 6\' 6"  (1.981 m)   Wt (!) 140.6 kg   SpO2 100%   BMI 35.82 kg/m  Physical Exam Vitals and nursing note reviewed.  Constitutional:      General: He is not in acute distress.    Appearance: He is well-developed. He is obese.  HENT:     Head: Normocephalic and atraumatic.     Right Ear: External ear normal.  Left Ear: External ear normal.     Mouth/Throat:     Mouth: Mucous membranes are moist.  Eyes:     General: No scleral icterus. Cardiovascular:     Rate and Rhythm: Normal rate and regular rhythm.     Pulses: Normal pulses.     Heart sounds: Normal heart sounds.     No S3 or S4 sounds.  Pulmonary:     Effort: Pulmonary effort is normal. No respiratory distress.     Breath sounds: Normal breath sounds.  Abdominal:     General: Abdomen is flat.     Palpations: Abdomen is soft.     Tenderness: There is no abdominal tenderness.  Musculoskeletal:        General: Normal range of  motion.     Cervical back: Normal range of motion.     Right lower leg: No edema.     Left lower leg: No edema.  Skin:    General: Skin is warm and dry.     Capillary Refill: Capillary refill takes less than 2 seconds.  Neurological:     Mental Status: He is alert and oriented to person, place, and time.     GCS: GCS eye subscore is 4. GCS verbal subscore is 5. GCS motor subscore is 6.  Psychiatric:        Mood and Affect: Mood normal.        Behavior: Behavior normal.    ED Results / Procedures / Treatments   Labs (all labs ordered are listed, but only abnormal results are displayed) Labs Reviewed  BASIC METABOLIC PANEL - Abnormal; Notable for the following components:      Result Value   Glucose, Bld 105 (*)    Creatinine, Ser 1.48 (*)    All other components within normal limits  CBC  TROPONIN I (HIGH SENSITIVITY)  TROPONIN I (HIGH SENSITIVITY)    EKG EKG Interpretation  Date/Time:  Monday December 05 2021 08:41:27 EDT Ventricular Rate:  62 PR Interval:  164 QRS Duration: 84 QT Interval:  376 QTC Calculation: 381 R Axis:   49 Text Interpretation: Normal sinus rhythm with sinus arrhythmia Nonspecific T wave abnormality Abnormal ECG When compared with ECG of 05-Dec-2021 08:12, PREVIOUS ECG IS PRESENT suspect BER Confirmed by Wynona Dove ([id number]) on 12/05/2021 11:32:15 AM  Radiology DG Chest 2 View  Result Date: 12/05/2021 CLINICAL DATA:  Chest pain EXAM: CHEST - 2 VIEW COMPARISON:  Radiograph 11/20/2017 FINDINGS: The heart size and mediastinal contours are within normal limits.No focal airspace disease. No pleural effusion or pneumothorax.No acute osseous abnormality. IMPRESSION: No evidence of acute cardiopulmonary disease. Electronically Signed   By: Maurine Simmering M.D.   On: 12/05/2021 09:30    Procedures Procedures  {Document cardiac monitor, telemetry assessment procedure when appropriate:1}  Medications Ordered in ED Medications  lidocaine (LIDODERM) 5 % 1 patch  (1 patch Transdermal Patch Applied 12/05/21 1155)  acetaminophen (TYLENOL) tablet 1,000 mg (1,000 mg Oral Given 12/05/21 1154)    ED Course/ Medical Decision Making/ A&P                           Medical Decision Making Amount and/or Complexity of Data Reviewed Labs: ordered. Radiology: ordered.  Risk OTC drugs. Prescription drug management.   This patient presents to the ED with chief complaint(s) of cp with pertinent past medical history of obesity which further complicates the presenting complaint. The complaint involves an extensive differential diagnosis  and also carries with it a high risk of complications and morbidity.     Differential includes all life-threatening causes for chest pain. This includes but is not exclusive to acute coronary syndrome, aortic dissection, pulmonary embolism, cardiac tamponade, community-acquired pneumonia, pericarditis, musculoskeletal chest wall pain, etc.  . Serious etiologies were considered.   The initial plan is to screening lab/ekg ordered in triage. Will get delta trop. Medicate    Additional history obtained: Additional history obtained from  na Records reviewed [facility] labs interpretation:  The following labs were independently interpreted: metabolic panel with elevated Cr, no prior available to review. Unclear what his baseline is. Reports normal po and normal urination last few days.   Independent visualization of imaging: - I independently visualized the following imaging with scope of interpretation limited to determining acute life threatening conditions related to emergency care: CXR, which revealed no acute process  Cardiac monitoring was reviewed and interpreted by myself which shows NSR EKG with likely BER  Treatment and Reassessment: Lidoderm, tylenol, po chal  Consultation: - Consulted or discussed management/test interpretation w/ external professional: na  Consideration for admission  or further workup: Admission was considered    The patient's chest pain is not suggestive of pulmonary embolus, cardiac ischemia, aortic dissection, pericarditis, myocarditis, pulmonary embolism, pneumothorax, pneumonia, Zoster, or esophageal perforation, or other serious etiology.  Historically not abrupt in onset, tearing or ripping, pulses symmetric. EKG nonspecific for ischemia/infarction. No dysrhythmias, brugada, WPW, prolonged QT noted.   Troponin negative x2. CXR reviewed. Labs without demonstration of acute pathology unless otherwise noted above. Low HEART Score: 0-3 points (0.9-1.7% risk of MACE).  Given the extremely low risk of these diagnoses further testing and evaluation for these possibilities does not appear to be indicated at this time. Patient in no distress and overall condition improved here in the ED. Detailed discussions were had with the patient regarding current findings, and need for close f/u with PCP or on call doctor. The patient has been instructed to return immediately if the symptoms worsen in any way for re-evaluation. Patient verbalized understanding and is in agreement with current care plan. All questions answered prior to discharge.   Social Determinants of health: Counseled patient for approximately 3 minutes regarding smoking cessation. Discussed risks of smoking and how they applied and affected their visit here today. Patient not ready to quit at this time, however will follow up with their primary doctor when they are.   CPT code: [phone]: intermediate counseling for smoking cessation   Social History   Tobacco Use  . Smoking status: Every Day    Packs/day: 0.40    Years: 10.00    Total pack years: 4.00    Types: Cigarettes  . Smokeless tobacco: Never  Vaping Use  . Vaping Use: Never used  Substance Use Topics  . Alcohol use: Yes    Comment: rare  . Drug use: No      {Document critical care time when appropriate:1} {Document review of labs  and clinical decision tools ie heart score, Chads2Vasc2 etc:1}  {Document your independent review of radiology images, and any outside records:1} {Document your discussion with family members, caretakers, and with consultants:1} {Document social determinants of health affecting pt's care:1} {Document your decision making why or why not admission, treatments were needed:1} Final Clinical Impression(s) / ED Diagnoses Final diagnoses:  None    Rx / DC Orders ED Discharge Orders     None

## 2022-01-08 ENCOUNTER — Emergency Department (HOSPITAL_BASED_OUTPATIENT_CLINIC_OR_DEPARTMENT_OTHER)
Admission: EM | Admit: 2022-01-08 | Discharge: 2022-01-08 | Disposition: A | Discharge status: Home / Self Care | Payer: BC Managed Care – PPO | Attending: Emergency Medicine | Admitting: Emergency Medicine

## 2022-01-08 ENCOUNTER — Other Ambulatory Visit: Payer: Self-pay

## 2022-01-08 ENCOUNTER — Encounter (HOSPITAL_BASED_OUTPATIENT_CLINIC_OR_DEPARTMENT_OTHER): Payer: Self-pay | Admitting: Emergency Medicine

## 2022-01-08 ENCOUNTER — Emergency Department (HOSPITAL_BASED_OUTPATIENT_CLINIC_OR_DEPARTMENT_OTHER): Payer: BC Managed Care – PPO

## 2022-01-08 DIAGNOSIS — F17200 Nicotine dependence, unspecified, uncomplicated: Secondary | ICD-10-CM | POA: Insufficient documentation

## 2022-01-08 DIAGNOSIS — R059 Cough, unspecified: Secondary | ICD-10-CM | POA: Diagnosis present

## 2022-01-08 DIAGNOSIS — Z1152 Encounter for screening for COVID-19: Secondary | ICD-10-CM | POA: Diagnosis not present

## 2022-01-08 DIAGNOSIS — J168 Pneumonia due to other specified infectious organisms: Secondary | ICD-10-CM | POA: Diagnosis not present

## 2022-01-08 DIAGNOSIS — J189 Pneumonia, unspecified organism: Secondary | ICD-10-CM

## 2022-01-08 LAB — RESP PANEL BY RT-PCR (FLU A&B, COVID) ARPGX2
Influenza A by PCR: NEGATIVE
Influenza B by PCR: NEGATIVE
SARS Coronavirus 2 by RT PCR: NEGATIVE

## 2022-01-08 MED ORDER — AZITHROMYCIN 250 MG PO TABS: 250.0000 mg | ORAL_TABLET | Freq: Every day | ORAL | 0 refills | Status: AC

## 2022-01-08 MED ORDER — AMOXICILLIN-POT CLAVULANATE 875-125 MG PO TABS: 1.0000 | ORAL_TABLET | Freq: Two times a day (BID) | ORAL | 0 refills | Status: DC

## 2022-01-08 NOTE — Discharge Instructions (Addendum)
Thank you for coming to [facility] Emergency Department. You were seen for cough and chest tightness. We did an exam, labs, and imaging, and these showed a right lower lobe pneumonia.  We will prescribe Augmentin and azithromycin antibiotics for this infection, please take them as prescribed for 7 days. Please follow up with your primary care provider within 1 week.  You can call the number health connect provided to establish with a primary care physician. Please quit smoking tobacco.  Do not hesitate to return to the ED or call 911 if you experience: -Worsening symptoms -Worsening chest pain or shortness of breath -Lightheadedness, passing out -Fevers/chills -Anything else that concerns you

## 2022-01-08 NOTE — ED Triage Notes (Signed)
Pt arrives pov, steady gait, c/o cough for "a few weeks", worsening x 4 days. Reports chest tightness  x 3 days after emesis

## 2022-01-08 NOTE — ED Provider Notes (Signed)
[facility] EMERGENCY DEPARTMENT Provider Note   CSN: [id number] Arrival date & time: 01/08/22  1015     History  Chief Complaint  Patient presents with   Cough    Mario Ingram is a 36 y.o. male with no significant PMH presents with cough.   Patient complains of a dry cough for "a few weeks", worsening x 4 days. Reports chest tightness x 3 days, denies chest pain.  Also denies fever/chills, shortness of breath, wheezing, abdominal pain, diarrhea/constipation, sore throat, sick contacts, lower extremity edema.  Patient is a smoker of tobacco and used to use vapes as well.  He did vomit 1 Saturday but does not feel nauseated now.   Cough      Home Medications Prior to Admission medications   Medication Sig Start Date End Date Taking? Authorizing Provider  amoxicillin-clavulanate (AUGMENTIN) 875-125 MG tablet Take 1 tablet by mouth every 12 (twelve) hours. 01/08/22  Yes Loetta Rough, MD  azithromycin (ZITHROMAX) 250 MG tablet Take 1 tablet (250 mg total) by mouth daily for 7 days. Take first 2 tablets together, then 1 every day until finished. 01/08/22 01/15/22 Yes Loetta Rough, MD  acetaminophen (TYLENOL) 325 MG tablet Take 2 tablets (650 mg total) by mouth every 6 (six) hours as needed. 12/05/21   Sloan Leiter, DO  diclofenac (VOLTAREN) 75 MG EC tablet Take 1 tablet (75 mg total) by mouth 2 (two) times daily. Patient not taking: Reported on 12/05/2021 01/21/19   Mardella Layman, MD  ibuprofen (ADVIL) 800 MG tablet Take 1 tablet (800 mg total) by mouth every 8 (eight) hours as needed. 12/05/21   Tanda Rockers A, DO  lidocaine (LIDODERM) 5 % Place 1 patch onto the skin daily as needed. Remove & Discard patch within 12 hours or as directed by MD 12/05/21   Sloan Leiter, DO  methocarbamol (ROBAXIN) 500 MG tablet Take 1 tablet (500 mg total) by mouth 2 (two) times daily. Patient not taking: Reported on 12/05/2021 05/16/21   Bing Neighbors, FNP      Allergies     Patient has no known allergies.    Review of Systems   Review of Systems  Respiratory:  Positive for cough.    Review of systems Negative for f/c.  A 10 point review of systems was performed and is negative unless otherwise reported in HPI.  Physical Exam Updated Vital Signs BP 123/74   Pulse 79   Temp 97.9 F (36.6 C) (Oral)   Resp 20   Ht 6\' 6"  (1.981 m)   Wt (!) 141.5 kg   SpO2 97%   BMI 36.06 kg/m  Physical Exam General: Normal appearing male, lying in bed.  HEENT: Sclera anicteric, MMM, trachea midline.  Cardiology: RRR, no murmurs/rubs/gallops. BL radial and DP pulses equal bilaterally.  Resp: Normal respiratory rate and effort. CTAB, no wheezes, rhonchi, crackles.  No increased work of breathing Abd: Soft, non-tender, non-distended. No rebound tenderness or guarding.  GU: Deferred. MSK: No peripheral edema or signs of trauma. Skin: warm, dry.  Neuro: A&Ox4, CNs II-XII grossly intact. MAEs. Sensation grossly intact.  Psych: Normal mood and affect.   ED Results / Procedures / Treatments   Labs (all labs ordered are listed, but only abnormal results are displayed) Labs Reviewed  RESP PANEL BY RT-PCR (FLU A&B, COVID) [id number]    EKG EKG Interpretation  Date/Time:  Sunday January 08 2022 11:57:54 EST Ventricular Rate:  66 PR Interval:  155 QRS Duration:  96 QT Interval:  364 QTC Calculation: 382 R Axis:   32 Text Interpretation: Sinus rhythm Borderline T abnormalities, lateral leads Benign early repolarization Similar to prior EKGs Confirmed by Vivi Barrack [phone]) on 01/08/2022 12:37:12 PM  Radiology DG Chest 2 View  Result Date: 01/08/2022 CLINICAL DATA:  Cough a few weeks worse over the past few days with chest tightness and emesis. EXAM: CHEST - 2 VIEW COMPARISON:  12/05/2021 FINDINGS: Lungs are adequately inflated with minimal patchy density over the right base which may be due to atelectasis or early infection. No evidence of effusion. Cardiomediastinal  silhouette and remainder of the exam is unchanged. IMPRESSION: Minimal patchy density over the right base which may be due to atelectasis or early infection. Electronically Signed   By: Elberta Fortis M.D.   On: 01/08/2022 10:48    Procedures Procedures    Medications Ordered in ED Medications - No data to display  ED Course/ Medical Decision Making/ A&P                          Medical Decision Making Amount and/or Complexity of Data Reviewed Radiology: ordered.    Patient is well-appearing and vitally stable, SORA.  This patient presents to the ED for concern of cough. I considered the following differential and admission for this acute condition.    MDM:    For patient's cough, consider upper respiratory infection such as viral URI/COVID/flu, bronchitis, pneumonia.  Patient's EKG with benign early repol and no signs of ischemia, low concern overall for ACS at this time.  Consider other possible causes of cough as well, including rhinitis/allergies, GERD.  He has no shortness of breath/hypoxia or signs and symptoms of DVT to indicate a PE.  There is no wheezing and no history of reactive airway disease/COPD/asthma, low concern for that at this time.  Patient is not hypoxic, stable on room air, no increased work of breathing or respiratory distress.   Clinical Course as of 01/08/22 1300  Sun Jan 08, 2022  1148 DG Chest 2 View FINDINGS: Lungs are adequately inflated with minimal patchy density over the right base which may be due to atelectasis or early infection. No evidence of effusion. Cardiomediastinal silhouette and remainder of the exam is unchanged.   IMPRESSION: Minimal patchy density over the right base which may be due to atelectasis or early infection.  [HN]  1148 Resp Panel by RT-PCR (Flu A&B, Covid) Anterior Nasal Swab Neg [HN]    Clinical Course User Index [HN] Loetta Rough, MD    Imaging Studies ordered: I ordered imaging studies including CXR which  demonstrated RLL density I independently visualized and interpreted imaging. I agree with the radiologist interpretation  Additional history obtained from patient.    Cardiac Monitoring: The patient was maintained on a cardiac monitor.  I personally viewed and interpreted the cardiac monitored which showed an underlying rhythm of: NSR  Social Determinants of Health:  patient lives independently  Disposition: Given patient's clinical symptoms as well as right lower lobe density I suspect right lower lobe pneumonia as the cause.  Will prescribe Augmentin and azithromycin.  Patient is not in respiratory distress and he is safe for discharge at this time.  Patient is given information for health connect and establishing with a primary care physician, instructed to follow-up in 1 week.  Discharge with discharge instructions and return precautions as well as prescriptions for Augmentin and azithromycin for early right lower lobe pneumonia.  Counseled on smoking cessation.  Co morbidities that complicate the patient evaluation  Past Medical History:  Diagnosis Date   Dyspnea    with exertion     Medicines Meds ordered this encounter  Medications   amoxicillin-clavulanate (AUGMENTIN) 875-125 MG tablet    Sig: Take 1 tablet by mouth every 12 (twelve) hours.    Dispense:  14 tablet    Refill:  0   azithromycin (ZITHROMAX) 250 MG tablet    Sig: Take 1 tablet (250 mg total) by mouth daily for 7 days. Take first 2 tablets together, then 1 every day until finished.    Dispense:  7 tablet    Refill:  0    I have reviewed the patients home medicines and have made adjustments as needed  Problem List / ED Course: Problem List Items Addressed This Visit   None Visit Diagnoses     Pneumonia of right lower lobe due to infectious organism    -  Primary   Relevant Medications   amoxicillin-clavulanate (AUGMENTIN) 875-125 MG tablet   azithromycin (ZITHROMAX) 250 MG tablet                This note was created using dictation software, which may contain spelling or grammatical errors.    Loetta Rough, MD 01/08/22 [phone]

## 2022-01-08 NOTE — ED Notes (Signed)
ED Provider at bedside. 

## 2022-03-05 ENCOUNTER — Encounter (HOSPITAL_COMMUNITY): Payer: Self-pay

## 2022-03-05 ENCOUNTER — Ambulatory Visit (HOSPITAL_COMMUNITY)
Admission: EM | Admit: 2022-03-05 | Discharge: 2022-03-05 | Disposition: A | Discharge status: Home / Self Care | Payer: BC Managed Care – PPO | Attending: Family Medicine | Admitting: Family Medicine

## 2022-03-05 DIAGNOSIS — M545 Low back pain, unspecified: Secondary | ICD-10-CM

## 2022-03-05 MED ORDER — KETOROLAC TROMETHAMINE 30 MG/ML IJ SOLN
30.0000 mg | Freq: Once | INTRAMUSCULAR | Status: AC
  Administered 2022-03-05: 30 mg via INTRAMUSCULAR

## 2022-03-05 MED ORDER — TIZANIDINE HCL 4 MG PO TABS: 4.0000 mg | ORAL_TABLET | Freq: Three times a day (TID) | ORAL | 0 refills | Status: AC | PRN

## 2022-03-05 MED ORDER — NAPROXEN 500 MG PO TABS: 500.0000 mg | ORAL_TABLET | Freq: Two times a day (BID) | ORAL | 0 refills | Status: AC | PRN

## 2022-03-05 MED ORDER — KETOROLAC TROMETHAMINE 30 MG/ML IJ SOLN
INTRAMUSCULAR | Status: AC
  Filled 2022-03-05: qty 1

## 2022-03-05 NOTE — Discharge Instructions (Signed)
You have been given a shot of Toradol 30 mg today.  Take naproxen 500 mg--1 tablet every 12 hours as needed for pain   Take tizanidine 4 mg--1 every 8 hours as needed for muscle spasms; this medication can cause dizziness and sleepiness

## 2022-03-05 NOTE — ED Triage Notes (Signed)
Here for lower back pain, after throwing the football yesterday. 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

## 2022-03-05 NOTE — ED Provider Notes (Signed)
[facility]    CSN: [id number] Arrival date & time: 03/05/22  1002      History   Chief Complaint No chief complaint on file.   HPI Mario Ingram is a 37 y.o. male.   HPI Here for pain in his left low back and flank/side.  It began yesterday when he threw the football.  He felt a pop along the lower rib cage in the area of mid and anterior axillary line.  He has been coughing some since he had pneumonia in early December, though he does feel better.  When he coughs it does make this pain worse.  No fever recently.  Past Medical History:  Diagnosis Date   Dyspnea    with exertion    Patient Active Problem List   Diagnosis Date Noted   Fracture of metacarpal bone 03/09/2017   Pain in thumb joint with movement, right 03/09/2017    Past Surgical History:  Procedure Laterality Date   KNEE ARTHROSCOPY Right    OPEN REDUCTION INTERNAL FIXATION (ORIF) METACARPAL Right 12/13/2016   Procedure: OPEN REDUCTION INTERNAL FIXATION (ORIF) RIGHT THUMB FRACTURE;  Surgeon: Roseanne Kaufman, MD;  Location: [facility];  Service: Orthopedics;  Laterality: Right;  60 MINS       Home Medications    Prior to Admission medications   Medication Sig Start Date End Date Taking? Authorizing Provider  acetaminophen (TYLENOL) 325 MG tablet Take 2 tablets (650 mg total) by mouth every 6 (six) hours as needed. 12/05/21   Jeanell Sparrow, DO  ibuprofen (ADVIL) 800 MG tablet Take 1 tablet (800 mg total) by mouth every 8 (eight) hours as needed. 12/05/21   Wynona Dove A, DO  lidocaine (LIDODERM) 5 % Place 1 patch onto the skin daily as needed. Remove & Discard patch within 12 hours or as directed by MD 12/05/21   Jeanell Sparrow, DO    Family History Family History  Problem Relation Age of Onset   Healthy Mother    Diabetes Father     Social History Social History   Tobacco Use   Smoking status: Every Day    Packs/day: 0.40    Years: 10.00    Total pack years: 4.00    Types:  Cigarettes   Smokeless tobacco: Never  Vaping Use   Vaping Use: Never used  Substance Use Topics   Alcohol use: Yes    Comment: rare   Drug use: No     Allergies   Patient has no known allergies.   Review of Systems Review of Systems   Physical Exam Triage Vital Signs ED Triage Vitals [03/05/22 1028]  Enc Vitals Group     BP 132/85     Pulse Rate 82     Resp 16     Temp 98.4 F (36.9 C)     Temp Source Oral     SpO2 100 %     Weight      Height      Head Circumference      Peak Flow      Pain Score      Pain Loc      Pain Edu?      Excl. in [place]?    No data found.  Updated Vital Signs BP 132/85 (BP Location: Left Arm)   Pulse 82   Temp 98.4 F (36.9 C) (Oral)   Resp 16   SpO2 100%   Visual Acuity Right Eye Distance:   Left Eye  Distance:   Bilateral Distance:    Right Eye Near:   Left Eye Near:    Bilateral Near:     Physical Exam Vitals reviewed.  Constitutional:      General: He is not in acute distress.    Appearance: He is not ill-appearing, toxic-appearing or diaphoretic.  Cardiovascular:     Rate and Rhythm: Normal rate and regular rhythm.     Heart sounds: No murmur heard. Pulmonary:     Effort: Pulmonary effort is normal. No respiratory distress.     Breath sounds: No stridor. No wheezing, rhonchi or rales.  Chest:     Chest wall: Tenderness (left lower costal margin, anterior and mid and posterior axillary line) present.  Skin:    Coloration: Skin is not pale.  Neurological:     General: No focal deficit present.     Mental Status: He is alert and oriented to person, place, and time.  Psychiatric:        Behavior: Behavior normal.      UC Treatments / Results  Labs (all labs ordered are listed, but only abnormal results are displayed) Labs Reviewed - No data to display  EKG   Radiology No results found.  Procedures Procedures (including critical care time)  Medications Ordered in UC Medications - No data to  display  Initial Impression / Assessment and Plan / UC Course  I have reviewed the triage vital signs and the nursing notes.  Pertinent labs & imaging results that were available during my care of the patient were reviewed by me and considered in my medical decision making (see chart for details).        We discussed possibly doing x-rays, but I do feel that even if this is some rib injury is the cartilaginous ribs, and that x-rays would not be that helpful.  Nonsteroidal anti-inflammatory relief and muscle relaxers are sent in, and he is given a shot of Toradol. Final Clinical Impressions(s) / UC Diagnoses   Final diagnoses:  None   Discharge Instructions   None    ED Prescriptions   None    PDMP not reviewed this encounter.   Barrett Henle, MD 03/05/22 1116

## 2022-08-07 DEATH — deceased
